# Patient Record
Sex: Female | Born: 1960 | Race: White | Hispanic: No | Marital: Married | Smoking: Current every day smoker
Health system: Southern US, Community
[De-identification: ages and names within clinical notes are randomized; demographics above are authoritative.]

## PROBLEM LIST (undated history)

## (undated) DIAGNOSIS — Q8740 Marfan's syndrome, unspecified: Secondary | ICD-10-CM

## (undated) DIAGNOSIS — J449 Chronic obstructive pulmonary disease, unspecified: Secondary | ICD-10-CM

## (undated) DIAGNOSIS — J45909 Unspecified asthma, uncomplicated: Secondary | ICD-10-CM

## (undated) DIAGNOSIS — I10 Essential (primary) hypertension: Secondary | ICD-10-CM

## (undated) HISTORY — PX: TUBAL LIGATION: SHX77

## (undated) HISTORY — DX: Marfan's syndrome, unspecified: Q87.40

## (undated) HISTORY — PX: MUSCLE BIOPSY: SHX716

## (undated) HISTORY — PX: ESOPHAGEAL DILATION: SHX303

## (undated) HISTORY — DX: Unspecified asthma, uncomplicated: J45.909

---

## 2004-12-11 ENCOUNTER — Emergency Department: Payer: Self-pay | Admitting: Emergency Medicine

## 2012-02-03 ENCOUNTER — Ambulatory Visit: Payer: Self-pay | Admitting: Unknown Physician Specialty

## 2012-02-04 LAB — COMPREHENSIVE METABOLIC PANEL
Albumin: 3.9 g/dL (ref 3.4–5.0)
Alkaline Phosphatase: 99 U/L (ref 50–136)
Anion Gap: 8 (ref 7–16)
BUN: 16 mg/dL (ref 7–18)
Calcium, Total: 8.5 mg/dL (ref 8.5–10.1)
EGFR (Non-African Amer.): >60
Glucose: 143 mg/dL — ABNORMAL HIGH (ref 65–99)
Potassium: 3.2 mmol/L — ABNORMAL LOW (ref 3.5–5.1)
SGOT(AST): 18 U/L (ref 15–37)
SGPT (ALT): 15 U/L (ref 12–78)
Total Protein: 7.7 g/dL (ref 6.4–8.2)

## 2012-02-04 LAB — CBC
HCT: 44.4 % (ref 35.0–47.0)
HGB: 14.8 g/dL (ref 12.0–16.0)
MCH: 30.7 pg (ref 26.0–34.0)
RBC: 4.82 10*6/uL (ref 3.80–5.20)
RDW: 13.1 % (ref 11.5–14.5)
WBC: 13.3 10*3/uL — ABNORMAL HIGH (ref 3.6–11.0)

## 2012-02-04 LAB — LIPASE, BLOOD: Lipase: 98 U/L (ref 73–393)

## 2012-02-04 LAB — MAGNESIUM: Magnesium: 1.9 mg/dL

## 2012-02-06 LAB — PATHOLOGY REPORT

## 2013-07-16 ENCOUNTER — Emergency Department: Payer: Self-pay | Admitting: Emergency Medicine

## 2014-09-15 ENCOUNTER — Encounter: Payer: Self-pay | Admitting: Internal Medicine

## 2014-09-15 ENCOUNTER — Ambulatory Visit (INDEPENDENT_AMBULATORY_CARE_PROVIDER_SITE_OTHER): Payer: No Typology Code available for payment source | Admitting: Internal Medicine

## 2014-09-15 VITALS — BP 143/74 | HR 77 | Temp 97.9°F | Ht 66.9 in | Wt 165.0 lb

## 2014-09-15 DIAGNOSIS — R1310 Dysphagia, unspecified: Secondary | ICD-10-CM | POA: Insufficient documentation

## 2014-09-15 DIAGNOSIS — G8929 Other chronic pain: Secondary | ICD-10-CM

## 2014-09-15 DIAGNOSIS — Z0000 Encounter for general adult medical examination without abnormal findings: Secondary | ICD-10-CM | POA: Insufficient documentation

## 2014-09-15 DIAGNOSIS — IMO0001 Reserved for inherently not codable concepts without codable children: Secondary | ICD-10-CM | POA: Insufficient documentation

## 2014-09-15 DIAGNOSIS — R1013 Epigastric pain: Secondary | ICD-10-CM

## 2014-09-15 DIAGNOSIS — Z720 Tobacco use: Secondary | ICD-10-CM

## 2014-09-15 DIAGNOSIS — J45909 Unspecified asthma, uncomplicated: Secondary | ICD-10-CM

## 2014-09-15 DIAGNOSIS — E663 Overweight: Secondary | ICD-10-CM | POA: Diagnosis not present

## 2014-09-15 DIAGNOSIS — R030 Elevated blood-pressure reading, without diagnosis of hypertension: Secondary | ICD-10-CM

## 2014-09-15 LAB — CBC WITH DIFFERENTIAL/PLATELET
Basophils Absolute: 0.1 10*3/uL (ref 0.0–0.1)
Basophils Relative: 1 % (ref 0–1)
EOS PCT: 5 % (ref 0–5)
Eosinophils Absolute: 0.5 10*3/uL (ref 0.0–0.7)
HCT: 46.5 % — ABNORMAL HIGH (ref 36.0–46.0)
Hemoglobin: 15.6 g/dL — ABNORMAL HIGH (ref 12.0–15.0)
Lymphocytes Relative: 19 % (ref 12–46)
Lymphs Abs: 1.9 10*3/uL (ref 0.7–4.0)
MCH: 30.1 pg (ref 26.0–34.0)
MCHC: 33.5 g/dL (ref 30.0–36.0)
MCV: 89.6 fL (ref 78.0–100.0)
MONOS PCT: 5 % (ref 3–12)
MPV: 9.2 fL (ref 8.6–12.4)
Monocytes Absolute: 0.5 10*3/uL (ref 0.1–1.0)
NEUTROS ABS: 6.9 10*3/uL (ref 1.7–7.7)
Neutrophils Relative %: 70 % (ref 43–77)
PLATELETS: 329 10*3/uL (ref 150–400)
RBC: 5.19 MIL/uL — ABNORMAL HIGH (ref 3.87–5.11)
RDW: 13.5 % (ref 11.5–15.5)
WBC: 9.9 10*3/uL (ref 4.0–10.5)

## 2014-09-15 LAB — LIPID PANEL
CHOLESTEROL: 185 mg/dL (ref 0–200)
HDL: 38 mg/dL — ABNORMAL LOW (ref >=46)
LDL Cholesterol: 120 mg/dL — ABNORMAL HIGH (ref 0–99)
TRIGLYCERIDES: 133 mg/dL (ref <150)
Total CHOL/HDL Ratio: 4.9 Ratio
VLDL: 27 mg/dL (ref 0–40)

## 2014-09-15 LAB — COMPLETE METABOLIC PANEL WITH GFR
ALT: 11 U/L (ref 0–35)
AST: 12 U/L (ref 0–37)
Albumin: 4.7 g/dL (ref 3.5–5.2)
Alkaline Phosphatase: 93 U/L (ref 39–117)
BUN: 15 mg/dL (ref 6–23)
CALCIUM: 9.4 mg/dL (ref 8.4–10.5)
CHLORIDE: 104 meq/L (ref 96–112)
CO2: 31 mEq/L (ref 19–32)
CREATININE: 0.66 mg/dL (ref 0.50–1.10)
GFR, Est African American: >89 mL/min
GFR, Est Non African American: >89 mL/min
Glucose, Bld: 99 mg/dL (ref 70–99)
Potassium: 4.2 mEq/L (ref 3.5–5.3)
SODIUM: 140 meq/L (ref 135–145)
Total Bilirubin: 0.3 mg/dL (ref 0.2–1.2)
Total Protein: 7.2 g/dL (ref 6.0–8.3)

## 2014-09-15 MED ORDER — OMEPRAZOLE 40 MG PO CPDR: 40.0000 mg | DELAYED_RELEASE_CAPSULE | Freq: Two times a day (BID) | ORAL | Status: DC

## 2014-09-15 MED ORDER — ALBUTEROL SULFATE HFA 108 (90 BASE) MCG/ACT IN AERS: 2.0000 | INHALATION_SPRAY | Freq: Four times a day (QID) | RESPIRATORY_TRACT | Status: DC | PRN

## 2014-09-15 NOTE — Assessment & Plan Note (Addendum)
BP today 157/76. Will continue to monitor and check CMP  ADDENDUM: CMP wnl   Farley LyAdam Travone Georg, MD Internal Medicine, PGY-1 Pager [phone] 09/16/2014, 11:37 AM

## 2014-09-15 NOTE — Assessment & Plan Note (Signed)
  Assessment: Progress toward smoking cessation:  smoking the same amount Barriers to progress toward smoking cessation:    Comments: She smokes about 1/3-1/4 ppd for the past 40 years. She says she has no interest in trying to quit now.  Plan: Instruction/counseling given:  I counseled patient on the dangers of tobacco use, advised patient to stop smoking, and reviewed strategies to maximize success. Educational resources provided:  [employer](1-800-QUIT-NOW) brochure (denies) Self management tools provided:    Medications to assist with smoking cessation:  None Patient agreed to the following self-care plans for smoking cessation:    Other plans: will continue to revisit at subsequent visits

## 2014-09-15 NOTE — Patient Instructions (Signed)
It was a pleasure to see you today. Please go to the GI (stomach) doctor. Please get your mammogram. Please do the lung breathing test. You are to take omeprazole 40 mg twice a day until you see the GI doctor. We will call you if there are any issues with your blood work. Please return to clinic or seek medical attention if you have any new or worsening abdominal pain, vomiting, fever, or other worrisome medical condition. We look forward to seeing you again in 2 months.  Farley LyAdam Erandi Lemma, MD  General Instructions:   Please try to bring all your medicines next time. This will help us keep you safe from mistakes.   Progress Toward Treatment Goals:  Treatment Goal 09/15/2014  Stop smoking smoking the same amount    Self Care Goals & Plans:  Self Care Goal 09/15/2014  Manage my medications (No Data)  Eat healthy foods drink diet soda or water instead of juice or soda; eat more vegetables; eat foods that are low in salt; eat baked foods instead of fried foods; eat fruit for snacks and desserts    No flowsheet data found.   Care Management & Community Referrals:  No flowsheet data found.

## 2014-09-15 NOTE — Assessment & Plan Note (Addendum)
Ms Blenda MountsMize reports chronic non-radiating epigastric abdominal pain that is worse with certain foods such as spaghetti with sauce or food with butter. She takes omeprazole 40 mg daily with no relief. She also has associated dysphagia (see separate problem). She does not note NSAID or alcohol use. Abdominal exam benign. Differential includes PUD, gastritis.  -CMP, CBC -referral to GI  ADDENDUM: CMP wnl makes hepatobiliary process unlikely. She is to follow-up with GI.   Farley LyAdam Shariff Lasky, MD Internal Medicine, PGY-1 Pager [phone] 09/16/2014, 11:38 AM

## 2014-09-15 NOTE — Assessment & Plan Note (Addendum)
Ms Wanda Rogers has agreed to mammogram as well as GI referral which will include colonoscopy. Will also check lipid panel, CBC, CMP today. She declined HIV testing.  ADDENDUM: CMP, CBC wnl. Lipid panel w total cholesterol 185, LDL 120, HDL 38, triglyceride 133. Will allow Wanda Rogers to discuss risks and benefits of statin with PCP and decide whether to initiate.   Farley LyAdam Raigan Baria, MD Internal Medicine, PGY-1 Pager [phone] 09/16/2014, 11:37 AM

## 2014-09-15 NOTE — Assessment & Plan Note (Addendum)
Ms Wanda Rogers believes she has a history of asthma. She is unsure if she had formal PFT in the past. She says she used to have albuterol nebulizer and rescue inhaler but has neither now. No wheezing on exam and sating 100% on  RA. She has 1/3 ppd x 40 year history of smoking -albuterol 2 puff inh q6hprn rescue inhaler prescribed -checking PFTs

## 2014-09-15 NOTE — Assessment & Plan Note (Signed)
Wanda Rogers reports dysphagia. She is unclear on the time line but says about a year and a half ago she went to the ED and had to have a scope to remove food that was stuck. She was told to follow-up with GI but did not because she did not have insurance at that time. She says this affects both liquid and solid and she thinks it has gotten progressively worse. She is currently taking omeprazole 40 mg daily. Differential includes GERD, eosinophilic esophagitis, achalasia, mass obstruction from cancer (she smokes, does not drink).  -refer to GI -increase omeprazole to 40 mg bid pending GI evaluation

## 2014-09-15 NOTE — Progress Notes (Signed)
   Subjective:    Patient ID: Wanda Rogers, female    DOB: 12/22/1960, 54 y.o.   MRN: [id number]  HPI  Wanda Rogers is a 54 year old woman with no past medical history of Marfan syndrome, asthma here to establish as a new patient. [facility] [place]. She says that she has had trouble with swallowing food. This is for both solids and liquids. It has gotten progressively worse. She said she once had to go to the ED and had to be scoped to remove food that was "stuck" in her throat about 1.5 years ago at [facility]. She says she got a letter saying to go back to the GI doctor to discuss something they saw but she did not have insurance at the time and never followed up. She also describes "knots" in the epigastric region. She says there is an achy pain that is always present but waxes and wanes in intensity. It does not radiate. She says some foods, such as those with butter, makes it worse. This has been for about the past year. She has occasional constipation but no diarrhea, nausea, emesis. She says she is taking omeprazole 40 mg daily.   She says she used to have a nebulizer machine and rescue inhaler. She says she can walk to her mailbox and then needs to catch her breath. She smokes about 3-4 cigarettes a day for about 40 years. She is not interested in quitting.  Review of Systems  Constitutional: Negative for fever, chills and diaphoresis.  Respiratory: Negative for chest tightness, shortness of breath and wheezing.   Cardiovascular: Negative for chest pain.  Gastrointestinal: Negative for nausea, vomiting, abdominal pain, diarrhea, constipation and blood in stool.  Neurological: Negative for dizziness, weakness, light-headedness and numbness.       Objective:   Physical Exam  Constitutional: She is oriented to person, place, and time. She appears well-developed and well-nourished. No distress.  HENT:  Head: Normocephalic and atraumatic.    Mouth/Throat: Oropharynx is clear and moist.  Eyes: EOM are normal. Pupils are equal, round, and reactive to light.  Cardiovascular: Normal rate, regular rhythm, normal heart sounds and intact distal pulses.  Exam reveals no gallop and no friction rub.   No murmur heard. Pulmonary/Chest: Effort normal and breath sounds normal. No respiratory distress. She has no wheezes.  Abdominal: Soft. Bowel sounds are normal. She exhibits no distension. There is no tenderness.  Neurological: She is alert and oriented to person, place, and time.  Skin: She is not diaphoretic.  Vitals reviewed.         Assessment & Plan:

## 2014-09-16 NOTE — Progress Notes (Signed)
Internal Medicine Clinic Attending  Case discussed with Dr. Rothman at the time of the visit.  We reviewed the resident's history and exam and pertinent patient test results.  I agree with the assessment, diagnosis, and plan of care documented in the resident's note. 

## 2014-10-01 ENCOUNTER — Encounter: Payer: Self-pay | Admitting: *Deleted

## 2014-10-06 ENCOUNTER — Ambulatory Visit (HOSPITAL_COMMUNITY)
Admission: RE | Admit: 2014-10-06 | Discharge: 2014-10-06 | Disposition: A | Discharge status: Home / Self Care | Payer: No Typology Code available for payment source | Source: Ambulatory Visit | Attending: Internal Medicine | Admitting: Internal Medicine

## 2014-10-06 DIAGNOSIS — J45909 Unspecified asthma, uncomplicated: Secondary | ICD-10-CM | POA: Insufficient documentation

## 2014-10-06 LAB — PULMONARY FUNCTION TEST
DL/VA % PRED: 76 %
DL/VA: 3.96 ml/min/mmHg/L
DLCO UNC % PRED: 64 %
DLCO unc: 18.33 ml/min/mmHg
FEF 25-75 PRE: 0.66 L/s
FEF 25-75 Post: 1.5 L/sec
FEF2575-%CHANGE-POST: 129 %
FEF2575-%Pred-Post: 54 %
FEF2575-%Pred-Pre: 23 %
FEV1-%CHANGE-POST: 26 %
FEV1-%PRED-PRE: 58 %
FEV1-%Pred-Post: 73 %
FEV1-POST: 2.21 L
FEV1-Pre: 1.75 L
FEV1FVC-%Change-Post: 5 %
FEV1FVC-%PRED-PRE: 69 %
FEV6-%Change-Post: 23 %
FEV6-%PRED-POST: 98 %
FEV6-%Pred-Pre: 79 %
FEV6-PRE: 2.95 L
FEV6-Post: 3.65 L
FEV6FVC-%Change-Post: 3 %
FEV6FVC-%Pred-Post: 99 %
FEV6FVC-%Pred-Pre: 95 %
FVC-%Change-Post: 19 %
FVC-%Pred-Post: 99 %
FVC-%Pred-Pre: 83 %
FVC-Post: 3.79 L
FVC-Pre: 3.17 L
POST FEV1/FVC RATIO: 58 %
POST FEV6/FVC RATIO: 96 %
Pre FEV1/FVC ratio: 55 %
Pre FEV6/FVC Ratio: 93 %
RV % pred: 148 %
RV: 3.01 L
TLC % PRED: 115 %
TLC: 6.33 L

## 2014-10-06 MED ORDER — ALBUTEROL SULFATE (2.5 MG/3ML) 0.083% IN NEBU
2.5000 mg | INHALATION_SOLUTION | Freq: Once | RESPIRATORY_TRACT | Status: AC
  Administered 2014-10-06: 2.5 mg via RESPIRATORY_TRACT

## 2014-10-07 ENCOUNTER — Encounter: Payer: Self-pay | Admitting: Gastroenterology

## 2014-10-13 NOTE — Consult Note (Signed)
PATIENT NAME:  Wanda Rogers, Wanda Rogers MR#:  [id number] DATE OF BIRTH:  1961-04-26  DATE OF CONSULTATION:  02/04/2012  REFERRING PHYSICIAN:   CONSULTING PHYSICIAN:  Scot Junobert T. Liyla Radliff, MD  HISTORY: The patient is a 54 year old white female who was eating homemade soup and a piece of beef got stuck in her esophagus. She went to the ER. The impaction occurred about 11 or 12 hours ago. She was given the usual treatments in the ER to try to induce this to pass. It did not do so. I was called and asked to see her.   HABITS: Smokes about five cigarettes a day. Smoked all of her adult life. Does not drink alcohol.   CURRENT MEDICATIONS:  1. Over-the-counter Benadryl. 2. Goody powder. 3. Over-the-counter Prilosec.    ALLERGIES: Does not tolerate Flexeril.   PAST MEDICAL HISTORY: She has had childbirth 5 times by vaginal delivery. No other hospitalizations operations.   The patient has had problems off and on swallowing meat. She denies any hematemesis. No asthma, wheezing or emphysema. No known heart disease.   PHYSICAL EXAMINATION:  GENERAL: White female in no acute distress.   VITAL SIGNS: Temperature 97.9, pulse 74, blood pressure 138/76.   HEENT: Sclerae anicteric. Conjunctiva negative. Tongue negative. Head atraumatic.   CHEST: Clear.   HEART: No murmurs or gallops.   ABDOMEN: No hepatosplenomegaly. No masses. No bruits. No significant tenderness.   SKIN: Warm and dry.   PSYCHIATRIC: Mood and affect are appropriate.   LABORATORY, DIAGNOSTIC AND RADIOLOGICAL DATA: Glucose 143, potassium 3.2, chloride 108. Liver panel normal. White blood count 13.3, hemoglobin 14.8.   ASSESSMENT: Foreign body obstruction of esophagus.   PLAN: Foreign body removal with anesthesia assistance this morning.    ____________________________ Scot Junobert T. Bay Wayson, MD rte:ap D: 02/04/2012 06:29:42 ET T: 02/04/2012 07:39:25 ET JOB#: [id number]  cc: Scot Junobert T. Quamere Mussell, MD, <Dictator> Scot JunOBERT T Hal Norrington  MD ELECTRONICALLY SIGNED 02/13/2012 18:32

## 2014-12-02 ENCOUNTER — Other Ambulatory Visit: Payer: Self-pay | Admitting: Internal Medicine

## 2014-12-14 ENCOUNTER — Ambulatory Visit (INDEPENDENT_AMBULATORY_CARE_PROVIDER_SITE_OTHER): Payer: No Typology Code available for payment source | Admitting: Gastroenterology

## 2014-12-14 ENCOUNTER — Encounter: Payer: Self-pay | Admitting: Gastroenterology

## 2014-12-14 VITALS — BP 124/90 | HR 76 | Ht 66.0 in | Wt 160.0 lb

## 2014-12-14 DIAGNOSIS — R1013 Epigastric pain: Secondary | ICD-10-CM | POA: Diagnosis not present

## 2014-12-14 DIAGNOSIS — G8929 Other chronic pain: Secondary | ICD-10-CM

## 2014-12-14 DIAGNOSIS — K5900 Constipation, unspecified: Secondary | ICD-10-CM

## 2014-12-14 DIAGNOSIS — R1310 Dysphagia, unspecified: Secondary | ICD-10-CM | POA: Diagnosis not present

## 2014-12-14 NOTE — Patient Instructions (Addendum)
You have been scheduled for an endoscopy. Please follow written instructions given to you at your visit today. If you use inhalers (even only as needed), please bring them with you on the day of your procedure. Your physician has requested that you go to www.startemmi.com and enter the access code given to you at your visit today. This web site gives a general overview about your procedure. However, you should still follow specific instructions given to you by our office regarding your preparation for the procedure.  It has been recommended to you by your physician that you have a(n) Screening colonoscopy completed. Per your request, we did not schedule the procedure(s) today. Please contact our office at [phone] should you decide to have the procedure completed.  Use Benefiber daily Use Miralax every 3 days as needed for constipation

## 2014-12-14 NOTE — Assessment & Plan Note (Signed)
History of dysphagia to solids and food impaction one year ago.  Strongly suspect she has an esophageal stricture.    Recommendations #1 upper endoscopy with dilation as indicated  Risks, alternatives, and complications of the procedure, including bleeding, perforation, and possible need for surgery, were explained to the patient.  Patient's questions were answered.  Cc Dr. Valentino Saxon

## 2014-12-14 NOTE — Assessment & Plan Note (Signed)
Abdominal pain is probably related to abdominal wall pain.  I don't appreciate a nodule, per se.  Recommendations #1 local heat and non-steroidal's

## 2014-12-14 NOTE — Progress Notes (Signed)
    _                                                                                                                History of Present Illness:  Wanda Rogers is a 54 year old white female referred at the request of Dr. Valentino Saxon for evaluation of dysphagia and abdominal pain.  She complains of dysphagia to solids.  Approximate year ago she had a food impaction requiring emergency endoscopy.  She has rare pyrosis.  She complains of a painful nodule in her upper abdomen.  She has chronic constipation and may move her bowels once every 5-6 days.  There is no history of rectal bleeding.   Past Medical History  Diagnosis Date  . Marfan's syndrome   . Asthma    History reviewed. No pertinent past surgical history. family history includes Colon cancer in her maternal grandmother; Heart disease in her son. Current Outpatient Prescriptions  Medication Sig Dispense Refill  . omeprazole (PRILOSEC) 40 MG capsule TAKE ONE CAPSULE BY MOUTH TWICE DAILY 60 capsule 3  . albuterol (PROVENTIL HFA;VENTOLIN HFA) 108 (90 BASE) MCG/ACT inhaler Inhale 2 puffs into the lungs every 6 (six) hours as needed for wheezing or shortness of breath (cough). (Patient not taking: Reported on 09/15/2014) 1 Inhaler 0   No current facility-administered medications for this visit.   Allergies as of 12/14/2014 - Review Complete 12/14/2014  Allergen Reaction Noted  . Flexeril [cyclobenzaprine] Nausea And Vomiting 09/15/2014    reports that she has been smoking Cigarettes.  She has been smoking about 0.30 packs per day. She does not have any smokeless tobacco history on file. Her alcohol and drug histories are not on file.   Review of Systems: Pertinent positive and negative review of systems were noted in the above HPI section. All other review of systems were otherwise negative.  Vital signs were reviewed in today's medical record Physical Exam: General: Well developed , well nourished, no acute distress Skin:  anicteric Head: Normocephalic and atraumatic Eyes:  sclerae anicteric, EOMI Ears: Normal auditory acuity Mouth: No deformity or lesions Neck: Supple, no masses or thyromegaly Lymph Nodes: no lymphadenopathy Lungs: Clear throughout to auscultation Heart: Regular rate and rhythm; no murmurs, rubs or bruits Gastroinestinal: Soft, and non distended. No masses, hepatosplenomegaly or hernias noted. Normal Bowel sounds.  There is very mild tenderness in the mid epigastrium to palpation which increases with abdominal muscle wall flexion.  I do not appreciate any nodules or masses. Rectal:deferred Musculoskeletal: Symmetrical with no gross deformities  Skin: No lesions on visible extremities Pulses:  Normal pulses noted Extremities: No clubbing, cyanosis, edema or deformities noted Neurological: Alert oriented x 4, grossly nonfocal Cervical Nodes:  No significant cervical adenopathy Inguinal Nodes: No significant inguinal adenopathy Psychological:  Alert and cooperative. Normal mood and affect  See Assessment and Plan under Problem List

## 2014-12-14 NOTE — Assessment & Plan Note (Signed)
Constipation is probably functional.  Patient has never had a colonoscopy.  Recommendations #1 fiber supplementation daily; MiraLAX every 3 days as needed #2 screening colonoscopy

## 2014-12-17 ENCOUNTER — Encounter: Payer: Self-pay | Admitting: Gastroenterology

## 2014-12-17 ENCOUNTER — Ambulatory Visit (AMBULATORY_SURGERY_CENTER): Payer: No Typology Code available for payment source | Admitting: Gastroenterology

## 2014-12-17 VITALS — BP 138/81 | HR 61 | Temp 97.5°F | Resp 16 | Ht 66.0 in | Wt 160.0 lb

## 2014-12-17 DIAGNOSIS — R1013 Epigastric pain: Secondary | ICD-10-CM

## 2014-12-17 DIAGNOSIS — K222 Esophageal obstruction: Secondary | ICD-10-CM

## 2014-12-17 DIAGNOSIS — R1310 Dysphagia, unspecified: Secondary | ICD-10-CM | POA: Diagnosis not present

## 2014-12-17 MED ORDER — SODIUM CHLORIDE 0.9 % IV SOLN: 500.0000 mL | INTRAVENOUS | Status: DC

## 2014-12-17 NOTE — Progress Notes (Signed)
Called to room to assist during endoscopic procedure.  Patient ID and intended procedure confirmed with present staff. Received instructions for my participation in the procedure from the performing physician.  

## 2014-12-17 NOTE — Patient Instructions (Signed)
YOU HAD AN ENDOSCOPIC PROCEDURE TODAY AT THE [facility]:   Refer to the procedure report that was given to you for any specific questions about what was found during the examination.  If the procedure report does not answer your questions, please call your gastroenterologist to clarify.  If you requested that your care partner not be given the details of your procedure findings, then the procedure report has been included in a sealed envelope for you to review at your convenience later.  YOU SHOULD EXPECT: Some feelings of bloating in the abdomen. Passage of more gas than usual.  Walking can help get rid of the air that was put into your GI tract during the procedure and reduce the bloating. If you had a lower endoscopy (such as a colonoscopy or flexible sigmoidoscopy) you may notice spotting of blood in your stool or on the toilet paper. If you underwent a bowel prep for your procedure, you may not have a normal bowel movement for a few days.  Please Note:  You might notice some irritation and congestion in your nose or some drainage.  This is from the oxygen used during your procedure.  There is no need for concern and it should clear up in a day or so.  SYMPTOMS TO REPORT IMMEDIATELY:   Following upper endoscopy (EGD)  Vomiting of blood or coffee ground material  New chest pain or pain under the shoulder blades  Painful or persistently difficult swallowing  New shortness of breath  Fever of 100F or higher  Black, tarry-looking stools  For urgent or emergent issues, a gastroenterologist can be reached at any hour by calling [phone].   DIET:NOTHING TO EAT OR DRINK UNTIL 2 PM. 2-3 PM ONLY CLEAR LIQUIDS. 3 PM UNTIL MORNING ONLY SOFT FOODS. RESUME YOUR REGULAR DIET IN AM.  ACTIVITY:  You should plan to take it easy for the rest of today and you should NOT DRIVE or use heavy machinery until tomorrow (because of the sedation medicines used during the test).    FOLLOW  UP: Our staff will call the number listed on your records the next business day following your procedure to check on you and address any questions or concerns that you may have regarding the information given to you following your procedure. If we do not reach you, we will leave a message.  However, if you are feeling well and you are not experiencing any problems, there is no need to return our call.  We will assume that you have returned to your regular daily activities without incident.  If any biopsies were taken you will be contacted by phone or by letter within the next 1-3 weeks.  Please call us at [phone] if you have not heard about the biopsies in 3 weeks.    SIGNATURES/CONFIDENTIALITY: You and/or your care partner have signed paperwork which will be entered into your electronic medical record.  These signatures attest to the fact that that the information above on your After Visit Summary has been reviewed and is understood.  Full responsibility of the confidentiality of this discharge information lies with you and/or your care-partner.

## 2014-12-17 NOTE — Op Note (Signed)
[facility] [address] [place], [address]   ENDOSCOPY PROCEDURE REPORT  PATIENT: Wanda, Rogers  MR#: [id number] BIRTHDATE: [birth year] , 53  yrs. old GENDER: female ENDOSCOPIST: Louis Meckel, MD REFERRED BY: PROCEDURE DATE:  12/17/2014 PROCEDURE:  EGD, diagnostic and Maloney dilation of esophagus ASA CLASS:     Class II INDICATIONS:  dysphagia. MEDICATIONS: Propofol 150 mg IV TOPICAL ANESTHETIC:  DESCRIPTION OF PROCEDURE: After the risks benefits and alternatives of the procedure were thoroughly explained, informed consent was obtained.  The LB [id number] endoscope was introduced through the mouth and advanced to the second portion of the duodenum , Without limitations.  The instrument was slowly withdrawn as the mucosa was fully examined.    ESOPHAGUS: There was a peptic stricture at the gastroesophageal junction.  The stricture was traversable.  The stricture was dilated using a 52mm (54Fr) Maloney dilator.   Except for the findings listed, the EGD was otherwise normal.  Retroflexed views revealed no abnormalities.     The scope was then withdrawn from the patient and the procedure completed.  COMPLICATIONS: There were no immediate complications.  ENDOSCOPIC IMPRESSION: 1.   There was a stricture at the gastroesophageal junction; The stricture was dilated using a 64mm (54Fr) Maloney dilator 2.   EGD was otherwise normal  RECOMMENDATIONS: EP dilation as needed  REPEAT EXAM:  eSigned:  Louis Meckel, MD 12/17/2014 1:12 PM    CC: Farley Ly, M.D.

## 2014-12-17 NOTE — Progress Notes (Signed)
Report to PACU, RN, vss, BBS= Clear.  

## 2014-12-18 ENCOUNTER — Telehealth: Payer: Self-pay | Admitting: *Deleted

## 2014-12-18 NOTE — Telephone Encounter (Signed)
No answer, left message to call if questions or concerns. 

## 2015-03-18 ENCOUNTER — Encounter: Payer: Self-pay | Admitting: Internal Medicine

## 2015-03-18 ENCOUNTER — Ambulatory Visit (INDEPENDENT_AMBULATORY_CARE_PROVIDER_SITE_OTHER): Payer: No Typology Code available for payment source | Admitting: Internal Medicine

## 2015-03-18 VITALS — BP 145/74 | HR 73 | Temp 98.1°F | Ht 66.9 in | Wt 161.0 lb

## 2015-03-18 DIAGNOSIS — Z7951 Long term (current) use of inhaled steroids: Secondary | ICD-10-CM

## 2015-03-18 DIAGNOSIS — F17210 Nicotine dependence, cigarettes, uncomplicated: Secondary | ICD-10-CM

## 2015-03-18 DIAGNOSIS — J4540 Moderate persistent asthma, uncomplicated: Secondary | ICD-10-CM

## 2015-03-18 MED ORDER — BUDESONIDE 180 MCG/ACT IN AEPB: 2.0000 | INHALATION_SPRAY | Freq: Two times a day (BID) | RESPIRATORY_TRACT | Status: AC

## 2015-03-18 NOTE — Patient Instructions (Signed)
Thanks for your visit today Please start using the inhaler- pulmicort 2 puffs twice a day Please follow up in 2 monthsAsthma Asthma is a recurring condition in which the airways tighten and narrow. Asthma can make it difficult to breathe. It can cause coughing, wheezing, and shortness of breath. Asthma episodes, also called asthma attacks, range from minor to life-threatening. Asthma cannot be cured, but medicines and lifestyle changes can help control it. CAUSES Asthma is believed to be caused by inherited (genetic) and environmental factors, but its exact cause is unknown. Asthma may be triggered by allergens, lung infections, or irritants in the air. Asthma triggers are different for each person. Common triggers include:   Animal dander.  Dust mites.  Cockroaches.  Pollen from trees or grass.  Mold.  Smoke.  Air pollutants such as dust, household cleaners, hair sprays, aerosol sprays, paint fumes, strong chemicals, or strong odors.  Cold air, weather changes, and winds (which increase molds and pollens in the air).  Strong emotional expressions such as crying or laughing hard.  Stress.  Certain medicines (such as aspirin) or types of drugs (such as beta-blockers).  Sulfites in foods and drinks. Foods and drinks that may contain sulfites include dried fruit, potato chips, and sparkling grape juice.  Infections or inflammatory conditions such as the flu, a cold, or an inflammation of the nasal membranes (rhinitis).  Gastroesophageal reflux disease (GERD).  Exercise or strenuous activity. SYMPTOMS Symptoms may occur immediately after asthma is triggered or many hours later. Symptoms include:  Wheezing.  Excessive nighttime or early morning coughing.  Frequent or severe coughing with a common cold.  Chest tightness.  Shortness of breath. DIAGNOSIS  The diagnosis of asthma is made by a review of your medical history and a physical exam. Tests may also be performed.  These may include:  Lung function studies. These tests show how much air you breathe in and out.  Allergy tests.  Imaging tests such as X-rays. TREATMENT  Asthma cannot be cured, but it can usually be controlled. Treatment involves identifying and avoiding your asthma triggers. It also involves medicines. There are 2 classes of medicine used for asthma treatment:   Controller medicines. These prevent asthma symptoms from occurring. They are usually taken every day.  Reliever or rescue medicines. These quickly relieve asthma symptoms. They are used as needed and provide short-term relief. Your health care provider will help you create an asthma action plan. An asthma action plan is a written plan for managing and treating your asthma attacks. It includes a list of your asthma triggers and how they may be avoided. It also includes information on when medicines should be taken and when their dosage should be changed. An action plan may also involve the use of a device called a peak flow meter. A peak flow meter measures how well the lungs are working. It helps you monitor your condition. HOME CARE INSTRUCTIONS   Take medicines only as directed by your health care provider. Speak with your health care provider if you have questions about how or when to take the medicines.  Use a peak flow meter as directed by your health care provider. Record and keep track of readings.  Understand and use the action plan to help minimize or stop an asthma attack without needing to seek medical care.  Control your home environment in the following ways to help prevent asthma attacks:  Do not smoke. Avoid being exposed to secondhand smoke.  Change your heating and air  conditioning filter regularly.  Limit your use of fireplaces and wood stoves.  Get rid of pests (such as roaches and mice) and their droppings.  Throw away plants if you see mold on them.  Clean your floors and dust regularly. Use unscented  cleaning products.  Try to have someone else vacuum for you regularly. Stay out of rooms while they are being vacuumed and for a short while afterward. If you vacuum, use a dust mask from a hardware store, a double-layered or microfilter vacuum cleaner bag, or a vacuum cleaner with a HEPA filter.  Replace carpet with wood, tile, or vinyl flooring. Carpet can trap dander and dust.  Use allergy-proof pillows, mattress covers, and box spring covers.  Wash bed sheets and blankets every week in hot water and dry them in a dryer.  Use blankets that are made of polyester or cotton.  Clean bathrooms and kitchens with bleach. If possible, have someone repaint the walls in these rooms with mold-resistant paint. Keep out of the rooms that are being cleaned and painted.  Wash hands frequently. SEEK MEDICAL CARE IF:   You have wheezing, shortness of breath, or a cough even if taking medicine to prevent attacks.  The colored mucus you cough up (sputum) is thicker than usual.  Your sputum changes from clear or white to yellow, green, gray, or bloody.  You have any problems that may be related to the medicines you are taking (such as a rash, itching, swelling, or trouble breathing).  You are using a reliever medicine more than 2-3 times per week.  Your peak flow is still at 50-79% of your personal best after following your action plan for 1 hour.  You have a fever. SEEK IMMEDIATE MEDICAL CARE IF:   You seem to be getting worse and are unresponsive to treatment during an asthma attack.  You are short of breath even at rest.  You get short of breath when doing very little physical activity.  You have difficulty eating, drinking, or talking due to asthma symptoms.  You develop chest pain.  You develop a fast heartbeat.  You have a bluish color to your lips or fingernails.  You are light-headed, dizzy, or faint.  Your peak flow is less than 50% of your personal best. MAKE SURE YOU:    Understand these instructions.  Will watch your condition.  Will get help right away if you are not doing well or get worse. Document Released: 06/12/2005 Document Revised: 10/27/2013 Document Reviewed: 01/09/2013 [facility] Patient Information 2015 [place], [place]. This information is not intended to replace advice given to you by your health care provider. Make sure you discuss any questions you have with your health care provider.

## 2015-03-18 NOTE — Progress Notes (Signed)
Medicine attending: I personally interviewed and briefly examined this patient, and reviewed pertinent clinical laboratory  data  with resident physician Dr.Parth Johnny Bridge. and we discussed a  management plan. We will add inhaled steroid to her bronchodilator. Smoking cessation counseling done.

## 2015-03-18 NOTE — Progress Notes (Signed)
Patient ID: Wanda Rogers, female   DOB: 09-18-1960, 54 y.o.   MRN: [id number]    Subjective:   Patient ID: Wanda Rogers female   DOB: February 24, 1961 54 y.o.   MRN: [id number]  HPI: [name] is a 54 y.o. 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     Past Medical History  Diagnosis Date  . Marfan's syndrome   . Asthma    Current Outpatient Prescriptions  Medication Sig Dispense Refill  . albuterol (PROVENTIL HFA;VENTOLIN HFA) 108 (90 BASE) MCG/ACT inhaler Inhale 2 puffs into the lungs every 6 (six) hours as needed for wheezing or shortness of breath (cough). 1 Inhaler 0  . budesonide (PULMICORT) 180 MCG/ACT inhaler Inhale 2 puffs into the lungs 2 (two) times daily. 1 Inhaler 5  . omeprazole (PRILOSEC) 40 MG capsule TAKE ONE CAPSULE BY MOUTH TWICE DAILY 60 capsule 3   No current facility-administered medications for this visit.   Family History  Problem Relation Age of Onset  . Colon cancer Maternal Grandmother   . Heart disease Son   . Kidney disease Son   . Liver disease Son    Social History   Social History  . Marital Status: Married    Spouse Name: N/A  . Number of Children: N/A  . Years of Education: N/A   Social History Main Topics  . Smoking status: Current Every Day Smoker -- 0.30 packs/day    Types: Cigarettes  . Smokeless tobacco: None  . Alcohol Use: 0.6 oz/week    0 Standard drinks or equivalent, 1 Cans of beer per week  . Drug Use: None  . Sexual Activity: Not Asked   Other Topics Concern  . None   Social History Narrative   Review of Systems: Review of Systems  Constitutional: Negative for fever and chills.  Respiratory: Positive for cough, shortness of breath and wheezing. Negative for sputum production.   Cardiovascular: Negative for chest pain and palpitations.  Gastrointestinal: Negative for heartburn, nausea and vomiting.  Neurological: Negative for headaches.    Objective:  Physical Exam: Filed Vitals:   03/18/15 1525  BP: 145/74  Pulse: 73  Temp: 98.1 F (36.7 C)  TempSrc: Oral  Height: 5' 6.9" (1.699 m)  Weight: 161 lb (73.029 kg)  SpO2: 96%   Physical Exam  Constitutional: She appears well-developed and well-nourished.  HENT:  Head: Normocephalic and atraumatic.  Neck: Normal range of motion.  Cardiovascular: Normal rate, regular rhythm, normal heart sounds and intact distal pulses.   No murmur heard. Pulmonary/Chest: Effort normal and breath sounds normal. She has no wheezes. She exhibits no tenderness.  Lymphadenopathy:    She has no cervical adenopathy.  Skin: Skin is warm.    Assessment & Plan:   Please see problem based charting for assessment and plan

## 2015-03-18 NOTE — Assessment & Plan Note (Signed)
Pt has 2 week history of dry cough, and says she feels congested. She wakes up at night coughing. She smokes 1/2 ppd for 40 years, also there is second hand smoke in her house.  She has been having to use her rescue albuterol inhaler upto 4 times a day now.  She denies systemic symptoms, no sore throat, productive cough, no URI type symptoms   She had PFTS done 3 months back which showed FEV1/FVC ratio of 55% with decreased DLCO, and moderate-severe airway obstructive disease, which was reversible on the test.   HEENT, and pulmonary exam unremarkable  Likely exacerbation of mild-moderate persistent asthma  Plan- Given Pulmicort 2 puffs bid, continue the albuterol Counseled regarding smoking cessation RTC in 2 months to reassess, sooner if needed

## 2015-04-09 ENCOUNTER — Other Ambulatory Visit: Payer: Self-pay | Admitting: Pulmonary Disease

## 2015-04-21 ENCOUNTER — Emergency Department: Payer: No Typology Code available for payment source

## 2015-04-21 ENCOUNTER — Emergency Department
Admission: EM | Admit: 2015-04-21 | Discharge: 2015-04-21 | Disposition: A | Discharge status: Home / Self Care | Payer: No Typology Code available for payment source | Attending: Emergency Medicine | Admitting: Emergency Medicine

## 2015-04-21 ENCOUNTER — Encounter: Payer: Self-pay | Admitting: *Deleted

## 2015-04-21 DIAGNOSIS — W010XXA Fall on same level from slipping, tripping and stumbling without subsequent striking against object, initial encounter: Secondary | ICD-10-CM | POA: Insufficient documentation

## 2015-04-21 DIAGNOSIS — Z720 Tobacco use: Secondary | ICD-10-CM | POA: Diagnosis not present

## 2015-04-21 DIAGNOSIS — S62396A Other fracture of fifth metacarpal bone, right hand, initial encounter for closed fracture: Secondary | ICD-10-CM | POA: Insufficient documentation

## 2015-04-21 DIAGNOSIS — Y9389 Activity, other specified: Secondary | ICD-10-CM | POA: Diagnosis not present

## 2015-04-21 DIAGNOSIS — Z79899 Other long term (current) drug therapy: Secondary | ICD-10-CM | POA: Insufficient documentation

## 2015-04-21 DIAGNOSIS — Y9289 Other specified places as the place of occurrence of the external cause: Secondary | ICD-10-CM | POA: Diagnosis not present

## 2015-04-21 DIAGNOSIS — S62309A Unspecified fracture of unspecified metacarpal bone, initial encounter for closed fracture: Secondary | ICD-10-CM

## 2015-04-21 DIAGNOSIS — Y998 Other external cause status: Secondary | ICD-10-CM | POA: Insufficient documentation

## 2015-04-21 DIAGNOSIS — S6991XA Unspecified injury of right wrist, hand and finger(s), initial encounter: Secondary | ICD-10-CM | POA: Diagnosis present

## 2015-04-21 HISTORY — DX: Marfan's syndrome, unspecified: Q87.40

## 2015-04-21 MED ORDER — IBUPROFEN 800 MG PO TABS: 800.0000 mg | ORAL_TABLET | Freq: Three times a day (TID) | ORAL | Status: DC | PRN

## 2015-04-21 MED ORDER — TRAMADOL HCL 50 MG PO TABS: 50.0000 mg | ORAL_TABLET | Freq: Four times a day (QID) | ORAL | Status: DC | PRN

## 2015-04-21 MED ORDER — IBUPROFEN 800 MG PO TABS
800.0000 mg | ORAL_TABLET | Freq: Once | ORAL | Status: AC
  Administered 2015-04-21: 800 mg via ORAL
  Filled 2015-04-21: qty 1

## 2015-04-21 MED ORDER — OXYCODONE-ACETAMINOPHEN 5-325 MG PO TABS
1.0000 | ORAL_TABLET | Freq: Once | ORAL | Status: AC
Start: 2015-04-21 — End: 2015-04-21
  Administered 2015-04-21: 1 via ORAL
  Filled 2015-04-21: qty 1

## 2015-04-21 NOTE — ED Provider Notes (Signed)
[facility] Emergency Department Provider Note  ____________________________________________  Time seen: Approximately 1:00 PM  I have reviewed the triage vital signs and the nursing notes.   HISTORY  Chief Complaint Hand Injury    HPI Wanda Rogers is a 54 y.o. female patient complaining of right hand pain and swelling secondary to a fall this morning. Patient states she slipped and fell breaking the fall with the right hand. Since the incident patient's have increased swelling and pain decreased range of motion with flexion of the finger the fourth digit right hand.Patient rates her pain discomfort as a 10 over 10. No palliative measures taken for this complaint. Patient is right-hand dominant.   Past Medical History  Diagnosis Date  . Marfan's syndrome   . Asthma   . Marfan syndrome     Patient Active Problem List   Diagnosis Date Noted  . Constipation 12/14/2014  . Elevated blood pressure 09/15/2014  . Dysphagia 09/15/2014  . Abdominal pain, chronic, epigastric 09/15/2014  . Overweight 09/15/2014  . Preventative health care 09/15/2014  . Asthma, chronic 09/15/2014  . Tobacco abuse 09/15/2014    Past Surgical History  Procedure Laterality Date  . Muscle biopsy    . Tubal ligation      Current Outpatient Rx  Name  Route  Sig  Dispense  Refill  . albuterol (PROVENTIL HFA;VENTOLIN HFA) 108 (90 BASE) MCG/ACT inhaler   Inhalation   Inhale 2 puffs into the lungs every 6 (six) hours as needed for wheezing or shortness of breath (cough).   1 Inhaler   0   . budesonide (PULMICORT) 180 MCG/ACT inhaler   Inhalation   Inhale 2 puffs into the lungs 2 (two) times daily.   1 Inhaler   5   . ibuprofen (ADVIL,MOTRIN) 800 MG tablet   Oral   Take 1 tablet (800 mg total) by mouth every 8 (eight) hours as needed for moderate pain.   15 tablet   0   . omeprazole (PRILOSEC) 40 MG capsule   Oral   Take 1 capsule (40 mg total) by mouth 2 (two) times  daily.   60 capsule   11   . traMADol (ULTRAM) 50 MG tablet   Oral   Take 1 tablet (50 mg total) by mouth every 6 (six) hours as needed for moderate pain.   12 tablet   0     Allergies Flexeril  Family History  Problem Relation Age of Onset  . Colon cancer Maternal Grandmother   . Heart disease Son   . Kidney disease Son   . Liver disease Son     Social History Social History  Substance Use Topics  . Smoking status: Current Every Day Smoker -- 0.30 packs/day    Types: Cigarettes  . Smokeless tobacco: None  . Alcohol Use: 0.6 oz/week    1 Cans of beer, 0 Standard drinks or equivalent per week    Review of Systems Constitutional: No fever/chills Eyes: No visual changes. ENT: No sore throat. Cardiovascular: Denies chest pain. Respiratory: Denies shortness of breath. Gastrointestinal: No abdominal pain.  No nausea, no vomiting.  No diarrhea.  No constipation. Genitourinary: Negative for dysuria. Musculoskeletal: Right hand pain.  Skin: Negative for rash. Neurological: Negative for headaches, focal weakness or numbness. Endocrine:Marfan syndrome Hematological/Lymphatic: Allergic/Immunilogical Flexeril  10-point ROS otherwise negative.  ____________________________________________   PHYSICAL EXAM:  VITAL SIGNS: ED Triage Vitals  Enc Vitals Group     BP 04/21/15 1246 165/91 mmHg  Pulse Rate 04/21/15 1246 72     Resp 04/21/15 1246 18     Temp 04/21/15 1246 98.1 F (36.7 C)     Temp Source 04/21/15 1246 Oral     SpO2 04/21/15 1246 99 %     Weight --      Height --      Head Cir --      Peak Flow --      Pain Score 04/21/15 1243 10     Pain Loc --      Pain Edu? --      Excl. in GC? --     Constitutional: Alert and oriented. Well appearing and in no acute distress. Eyes: Conjunctivae are normal. PERRL. EOMI. Head: Atraumatic. Nose: No congestion/rhinnorhea. Mouth/Throat: Mucous membranes are moist.  Oropharynx non-erythematous. Neck: No  stridor.  No cervical spine tenderness to palpation. Hematological/Lymphatic/Immunilogical: No cervical lymphadenopathy. Cardiovascular: Normal rate, regular rhythm. Grossly normal heart sounds.  Good peripheral circulation. Respiratory: Normal respiratory effort.  No retractions. Lungs CTAB. Gastrointestinal: Soft and nontender. No distention. No abdominal bruits. No CVA tenderness. Musculoskeletal: No obvious deformity for marked edema to the dorsal expect of the right hand along the third and fourth metacarpal.  Neurologic:  Normal speech and language. No gross focal neurologic deficits are appreciated. No gait instability. Skin:  Skin is warm, dry and intact. No rash noted. Psychiatric: Mood and affect are normal. Speech and behavior are normal.  ____________________________________________   LABS (all labs ordered are listed, but only abnormal results are displayed)  Labs Reviewed - No data to display ____________________________________________  EKG   ____________________________________________  RADIOLOGY  Fifth metatarsal head fracture. I, Joni Reiningonald K Leesha Veno, personally viewed and evaluated these images (plain radiographs) as part of my medical decision making.   ____________________________________________   PROCEDURES  Procedure(s) performed: None  Critical Care performed: No  ____________________________________________   INITIAL IMPRESSION / ASSESSMENT AND PLAN / ED COURSE  Pertinent labs & imaging results that were available during my care of the patient were reviewed by me and considered in my medical decision making (see chart for details).  Right fifth metacarpal head fracture. Discussed x-ray findings with patient. Patient placed in ulnar gutter splint and [facility]. Patient a prescription for tramadol and ibuprofen. ____________________________________________   FINAL CLINICAL IMPRESSION(S) / ED DIAGNOSES  Final  diagnoses:  Metacarpal bone fracture, closed, initial encounter      Joni ReiningRonald K Catarina Huntley, PA-C 04/21/15 1400  Jeanmarie PlantJames A McShane, MD 04/21/15 [phone]

## 2015-04-21 NOTE — ED Notes (Signed)
Pt here with c/o right hand injury this am from a fall.  Right hand swollen.

## 2015-04-21 NOTE — Discharge Instructions (Signed)
Wear splint until evaluation by Ortho Doctorl

## 2015-05-18 ENCOUNTER — Encounter: Payer: Self-pay | Admitting: Student

## 2015-10-09 ENCOUNTER — Emergency Department: Payer: No Typology Code available for payment source

## 2015-10-09 ENCOUNTER — Emergency Department
Admission: EM | Admit: 2015-10-09 | Discharge: 2015-10-09 | Disposition: A | Discharge status: Home / Self Care | Payer: No Typology Code available for payment source | Attending: Emergency Medicine | Admitting: Emergency Medicine

## 2015-10-09 ENCOUNTER — Encounter: Payer: Self-pay | Admitting: Emergency Medicine

## 2015-10-09 DIAGNOSIS — I10 Essential (primary) hypertension: Secondary | ICD-10-CM | POA: Diagnosis not present

## 2015-10-09 DIAGNOSIS — Z792 Long term (current) use of antibiotics: Secondary | ICD-10-CM | POA: Diagnosis not present

## 2015-10-09 DIAGNOSIS — R059 Cough, unspecified: Secondary | ICD-10-CM

## 2015-10-09 DIAGNOSIS — R079 Chest pain, unspecified: Secondary | ICD-10-CM | POA: Diagnosis present

## 2015-10-09 DIAGNOSIS — F17210 Nicotine dependence, cigarettes, uncomplicated: Secondary | ICD-10-CM | POA: Diagnosis not present

## 2015-10-09 DIAGNOSIS — J45909 Unspecified asthma, uncomplicated: Secondary | ICD-10-CM | POA: Insufficient documentation

## 2015-10-09 DIAGNOSIS — R509 Fever, unspecified: Secondary | ICD-10-CM

## 2015-10-09 DIAGNOSIS — J449 Chronic obstructive pulmonary disease, unspecified: Secondary | ICD-10-CM | POA: Diagnosis not present

## 2015-10-09 DIAGNOSIS — Z791 Long term (current) use of non-steroidal anti-inflammatories (NSAID): Secondary | ICD-10-CM | POA: Diagnosis not present

## 2015-10-09 DIAGNOSIS — Z7951 Long term (current) use of inhaled steroids: Secondary | ICD-10-CM | POA: Insufficient documentation

## 2015-10-09 DIAGNOSIS — R05 Cough: Secondary | ICD-10-CM | POA: Diagnosis not present

## 2015-10-09 HISTORY — DX: Chronic obstructive pulmonary disease, unspecified: J44.9

## 2015-10-09 HISTORY — DX: Essential (primary) hypertension: I10

## 2015-10-09 LAB — BASIC METABOLIC PANEL
ANION GAP: 6 (ref 5–15)
BUN: 10 mg/dL (ref 6–20)
CALCIUM: 9.1 mg/dL (ref 8.9–10.3)
CO2: 28 mmol/L (ref 22–32)
CREATININE: 0.61 mg/dL (ref 0.44–1.00)
Chloride: 107 mmol/L (ref 101–111)
GFR calc Af Amer: >60 mL/min (ref >60)
GLUCOSE: 108 mg/dL — AB (ref 65–99)
Potassium: 3.4 mmol/L — ABNORMAL LOW (ref 3.5–5.1)
Sodium: 141 mmol/L (ref 135–145)

## 2015-10-09 LAB — CBC
HCT: 47.3 % — ABNORMAL HIGH (ref 35.0–47.0)
HEMOGLOBIN: 15.7 g/dL (ref 12.0–16.0)
MCH: 29.8 pg (ref 26.0–34.0)
MCHC: 33.1 g/dL (ref 32.0–36.0)
MCV: 90.2 fL (ref 80.0–100.0)
PLATELETS: 236 10*3/uL (ref 150–440)
RBC: 5.25 MIL/uL — ABNORMAL HIGH (ref 3.80–5.20)
RDW: 13.9 % (ref 11.5–14.5)
WBC: 3.1 10*3/uL — ABNORMAL LOW (ref 3.6–11.0)

## 2015-10-09 LAB — TROPONIN I

## 2015-10-09 MED ORDER — IOPAMIDOL (ISOVUE-370) INJECTION 76%
75.0000 mL | Freq: Once | INTRAVENOUS | Status: AC | PRN
  Administered 2015-10-09: 75 mL via INTRAVENOUS

## 2015-10-09 MED ORDER — BENZONATATE 100 MG PO CAPS: 100.0000 mg | ORAL_CAPSULE | Freq: Four times a day (QID) | ORAL | Status: DC | PRN

## 2015-10-09 MED ORDER — SODIUM CHLORIDE 0.9 % IV SOLN
Freq: Once | INTRAVENOUS | Status: AC
  Administered 2015-10-09: 12:00:00 via INTRAVENOUS

## 2015-10-09 MED ORDER — IPRATROPIUM-ALBUTEROL 0.5-2.5 (3) MG/3ML IN SOLN
3.0000 mL | Freq: Once | RESPIRATORY_TRACT | Status: AC
  Administered 2015-10-09: 3 mL via RESPIRATORY_TRACT
  Filled 2015-10-09: qty 3

## 2015-10-09 MED ORDER — AZITHROMYCIN 250 MG PO TABS
ORAL_TABLET | ORAL | Status: AC
Start: 2015-10-09 — End: 2015-10-14

## 2015-10-09 NOTE — ED Notes (Signed)
Pt transported to CT ?

## 2015-10-09 NOTE — ED Notes (Signed)
Patient to ER with fevers (102 = highest), chest pain, and chest congestion.

## 2015-10-09 NOTE — Discharge Instructions (Signed)
Cough, Adult Coughing is a reflex that clears your throat and your airways. Coughing helps to heal and protect your lungs. It is normal to cough occasionally, but a cough that happens with other symptoms or lasts a long time may be a sign of a condition that needs treatment. A cough may last only 2-3 weeks (acute), or it may last longer than 8 weeks (chronic). CAUSES Coughing is commonly caused by:  Breathing in substances that irritate your lungs.  A viral or bacterial respiratory infection.  Allergies.  Asthma.  Postnasal drip.  Smoking.  Acid backing up from the stomach into the esophagus (gastroesophageal reflux).  Certain medicines.  Chronic lung problems, including COPD (or rarely, lung cancer).  Other medical conditions such as heart failure. HOME CARE INSTRUCTIONS  Pay attention to any changes in your symptoms. Take these actions to help with your discomfort:  Take medicines only as told by your health care provider.  If you were prescribed an antibiotic medicine, take it as told by your health care provider. Do not stop taking the antibiotic even if you start to feel better.  Talk with your health care provider before you take a cough suppressant medicine.  Drink enough fluid to keep your urine clear or pale yellow.  If the air is dry, use a cold steam vaporizer or humidifier in your bedroom or your home to help loosen secretions.  Avoid anything that causes you to cough at work or at home.  If your cough is worse at night, try sleeping in a semi-upright position.  Avoid cigarette smoke. If you smoke, quit smoking. If you need help quitting, ask your health care provider.  Avoid caffeine.  Avoid alcohol.  Rest as needed. SEEK MEDICAL CARE IF:   You have new symptoms.  You cough up pus.  Your cough does not get better after 2-3 weeks, or your cough gets worse.  You cannot control your cough with suppressant medicines and you are losing sleep.  You  develop pain that is getting worse or pain that is not controlled with pain medicines.  You have a fever.  You have unexplained weight loss.  You have night sweats. SEEK IMMEDIATE MEDICAL CARE IF:  You cough up blood.  You have difficulty breathing.  Your heartbeat is very fast.   This information is not intended to replace advice given to you by your health care provider. Make sure you discuss any questions you have with your health care provider.   Document Released: 12/09/2010 Document Revised: 03/03/2015 Document Reviewed: 08/19/2014 [employer] Interactive Patient Education [employer]   I will give you a prescription for Zithromax. Please use that as directed this to him the first day 1 every day after that. Return please for higher fever shortness of breath feeling sicker or if you're not any better in about 2 days. Please follow-up with your regular doctor Monday. Please have your doctor review the CAT scan report that we did today.  You can take Tessalon Perles for cough as directed.  [facility] or Dr. Elisabeth CaraGilbert's office since she don't have a regular medical doctor. You can also follow-up with [place] urgent care or return here if you get sicker.

## 2015-10-09 NOTE — ED Provider Notes (Signed)
[facility] Emergency Department Provider Note  ____________________________________________  Time seen: Approximately 11:46 AM  I have reviewed the triage vital signs and the nursing notes.   HISTORY  Chief Complaint Chest Pain   HPI Wanda Rogers is a 55 y.o. female who complains of a cough with pain in her chest with the cough. She's had a fever up to 102.3 in the last couple days. Cough is nonproductive. Patient also reports she's choked on food a couple times and may have aspirated something. Patient had her centimeters dilated in [place] last year by Dr. Acquanetta Chain since retired. She reports she still sometimes has trouble with things started to get caught in her esophagus.   Past Medical History  Diagnosis Date  . Marfan's syndrome   . Asthma   . Marfan syndrome   . COPD (chronic obstructive pulmonary disease) (HCC)   . Hypertension     Patient Active Problem List   Diagnosis Date Noted  . Constipation 12/14/2014  . Elevated blood pressure 09/15/2014  . Dysphagia 09/15/2014  . Abdominal pain, chronic, epigastric 09/15/2014  . Overweight 09/15/2014  . Preventative health care 09/15/2014  . Asthma, chronic 09/15/2014  . Tobacco abuse 09/15/2014    Past Surgical History  Procedure Laterality Date  . Muscle biopsy    . Tubal ligation    . Esophageal dilation      Current Outpatient Rx  Name  Route  Sig  Dispense  Refill  . albuterol (PROVENTIL HFA;VENTOLIN HFA) 108 (90 BASE) MCG/ACT inhaler   Inhalation   Inhale 2 puffs into the lungs every 6 (six) hours as needed for wheezing or shortness of breath (cough).   1 Inhaler   0   . azithromycin (ZITHROMAX Z-PAK) 250 MG tablet      Take 2 tablets (500 mg) on  Day 1,  followed by 1 tablet (250 mg) once daily on Days 2 through 5.   6 each   0   . benzonatate (TESSALON PERLES) 100 MG capsule   Oral   Take 1 capsule (100 mg total) by mouth every 6 (six) hours as needed for cough.   30  capsule   0   . budesonide (PULMICORT) 180 MCG/ACT inhaler   Inhalation   Inhale 2 puffs into the lungs 2 (two) times daily.   1 Inhaler   5   . ibuprofen (ADVIL,MOTRIN) 800 MG tablet   Oral   Take 1 tablet (800 mg total) by mouth every 8 (eight) hours as needed for moderate pain.   15 tablet   0   . omeprazole (PRILOSEC) 40 MG capsule   Oral   Take 1 capsule (40 mg total) by mouth 2 (two) times daily.   60 capsule   11   . traMADol (ULTRAM) 50 MG tablet   Oral   Take 1 tablet (50 mg total) by mouth every 6 (six) hours as needed for moderate pain.   12 tablet   0     Allergies Flexeril  Family History  Problem Relation Age of Onset  . Colon cancer Maternal Grandmother   . Heart disease Son   . Kidney disease Son   . Liver disease Son     Social History Social History  Substance Use Topics  . Smoking status: Current Every Day Smoker -- 0.30 packs/day    Types: Cigarettes  . Smokeless tobacco: None  . Alcohol Use: 0.6 oz/week    1 Cans of beer, 0 Standard drinks  or equivalent per week    Review of Systems Constitutional: No fever/chills Eyes: No visual changes. ENT: No sore throat. Cardiovascular:s chest pain. Respiratory: Denies shortness of breath. Gastrointestinal: No abdominal pain.  No nausea, no vomiting.  No diarrhea.  No constipation. Genitourinary: Negative for dysuria. Musculoskeletal: Negative for back pain. Skin: Negative for rash. Neurological: Negative for headaches, focal weakness or numbness.  10-point ROS otherwise negative.  ____________________________________________   PHYSICAL EXAM:  VITAL SIGNS: ED Triage Vitals  Enc Vitals Group     BP 10/09/15 1020 149/70 mmHg     Pulse Rate 10/09/15 1020 74     Resp 10/09/15 1020 20     Temp 10/09/15 1020 98.8 F (37.1 C)     Temp Source 10/09/15 1020 Oral     SpO2 10/09/15 1020 94 %     Weight 10/09/15 1020 150 lb (68.04 kg)     Height 10/09/15 1020 5' 10.5" (1.791 m)     Head  Cir --      Peak Flow --      Pain Score 10/09/15 1021 10     Pain Loc --      Pain Edu? --      Excl. in GC? --     Constitutional: Alert and oriented. Well appearing and in no acute distress. Eyes: Conjunctivae are normal. PERRL. EOMI. Head: Atraumatic. Nose: No congestion/rhinnorhea. Mouth/Throat: Mucous membranes are moist.  Oropharynx non-erythematous. Neck: No stridor.  Cardiovascular: Normal rate, regular rhythm. Grossly normal heart sounds.  Good peripheral circulation. Respiratory: Normal respiratory effort.  No retractions. Lungs CTAB. Gastrointestinal: Soft and nontender. No distention. No abdominal bruits. No CVA tenderness. Musculoskeletal: No lower extremity tenderness nor edema.  No joint effusions. Neurologic:  Normal speech and language. No gross focal neurologic deficits are appreciated. No gait instability. Skin:  Skin is warm, dry and intact. No rash noted. Psychiatric: Mood and affect are normal. Speech and behavior are normal.  ____________________________________________   LABS (all labs ordered are listed, but only abnormal results are displayed)  Labs Reviewed  BASIC METABOLIC PANEL - Abnormal; Notable for the following:    Potassium 3.4 (*)    Glucose, Bld 108 (*)    All other components within normal limits  CBC - Abnormal; Notable for the following:    WBC 3.1 (*)    RBC 5.25 (*)    HCT 47.3 (*)    All other components within normal limits  TROPONIN I   ____________________________________________  EKG  EKG read and interpreted by me shows normal sinus rhythm rate of 72 normal axis computer is reading some right atrial enlargement which is probably correct ____________________________________________  RADIOLOGY  CT of the chest shows no PE there are some enlarged nodes in some areas of groundglass appearance. In view of the patient's fever and cough this is probably an atypical  infection. ____________________________________________   PROCEDURES    ____________________________________________   INITIAL IMPRESSION / ASSESSMENT AND PLAN / ED COURSE  Pertinent labs & imaging results that were available during my care of the patient were reviewed by me and considered in my medical decision making (see chart for details).   ____________________________________________   FINAL CLINICAL IMPRESSION(S) / ED DIAGNOSES  Final diagnoses:  Cough  Other specified fever      Arnaldo NatalPaul F Malinda, MD 10/09/15 2103

## 2016-06-12 ENCOUNTER — Other Ambulatory Visit: Payer: Self-pay | Admitting: Internal Medicine

## 2016-07-16 IMAGING — CT CT ANGIO CHEST
2 of 6 series · 18 of 46 positions shown · IV contrast (APPLIED)
Comparison: No priors.

CLINICAL DATA: 54-year-old female presenting to the emergency
department with history of chest congestion, cough, fever and chest
pain since this morning.

EXAM:
CT ANGIOGRAPHY CHEST WITH CONTRAST
TECHNIQUE: Multidetector CT imaging of the chest was performed using the
standard protocol during bolus administration of intravenous
contrast. Multiplanar CT image reconstructions and MIPs were
obtained to evaluate the vascular anatomy.
CONTRAST:  75 mL of Isovue 370.

[Series 5: pe thins 1.5 · axial · 0.68mm/px · z∈[+762,+1064]mm · 15 of 282 slices shown]
[im 15/282  lung]
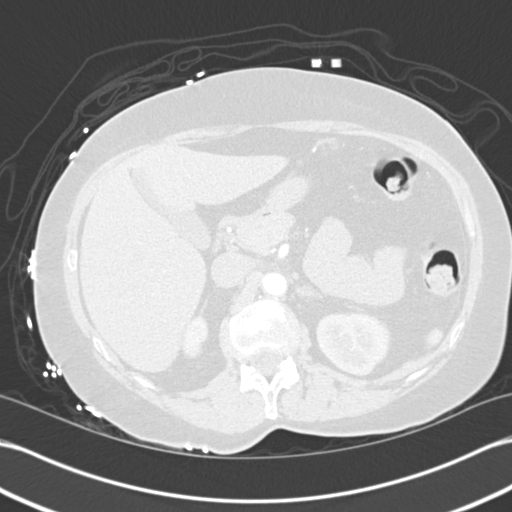
[im 30/282  soft-tissue]
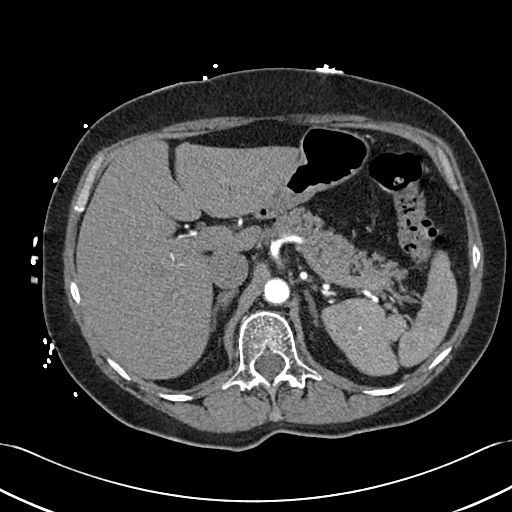
[im 60/282  lung]
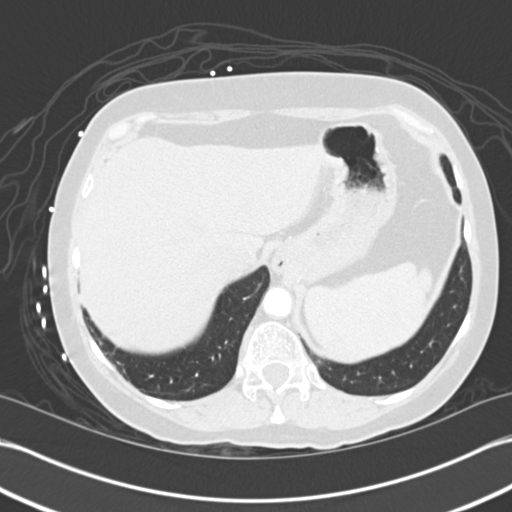
[im 74/282  soft-tissue]
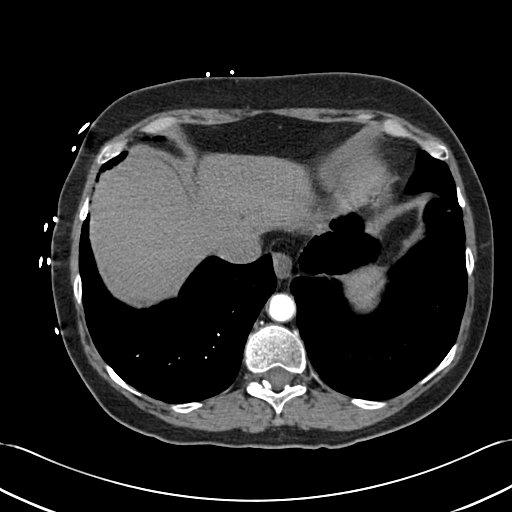
[im 89/282  lung]
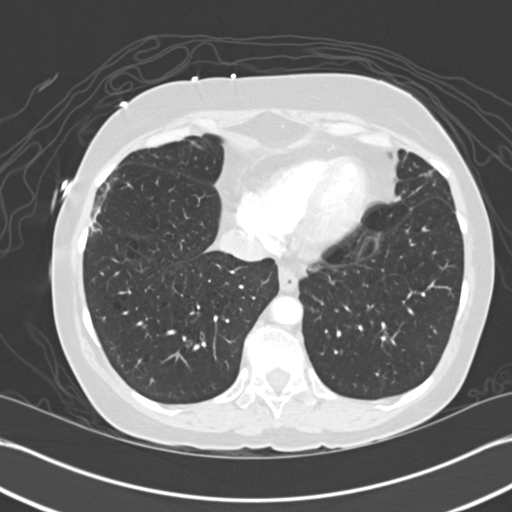
[im 104/282  soft-tissue]
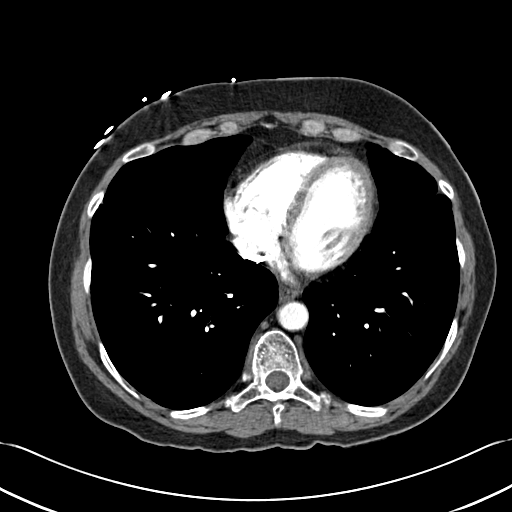
[im 119/282  lung]
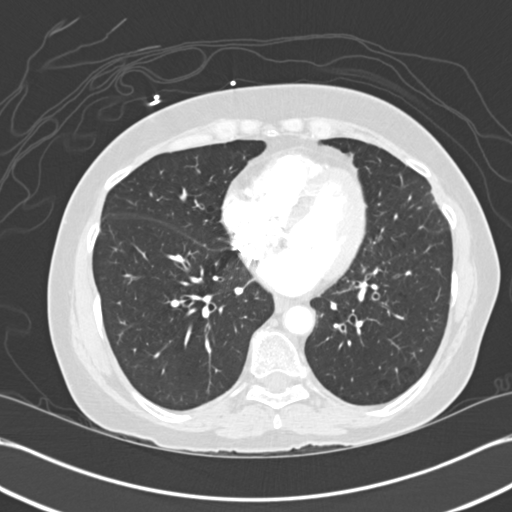
[im 148/282  soft-tissue]
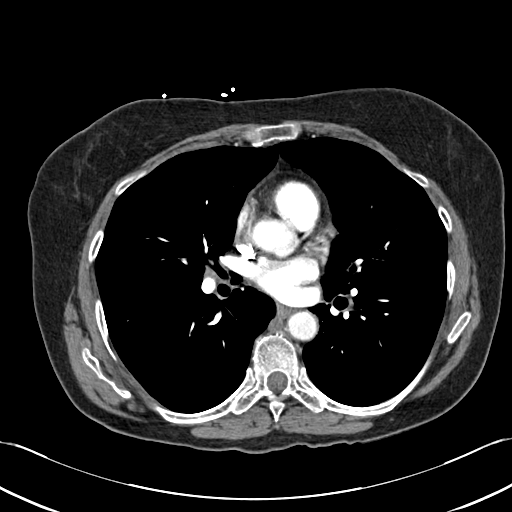
[im 163/282  lung]
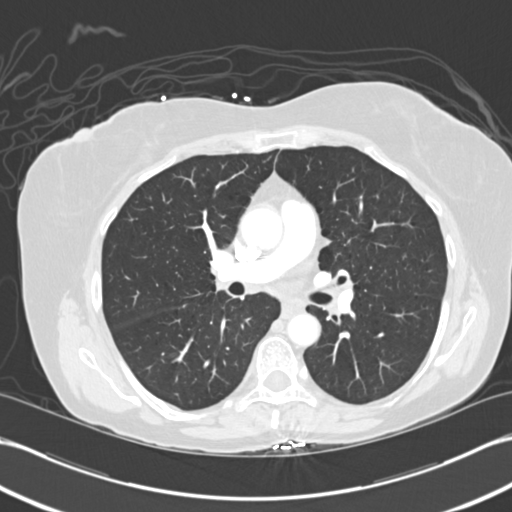
[im 178/282  soft-tissue]
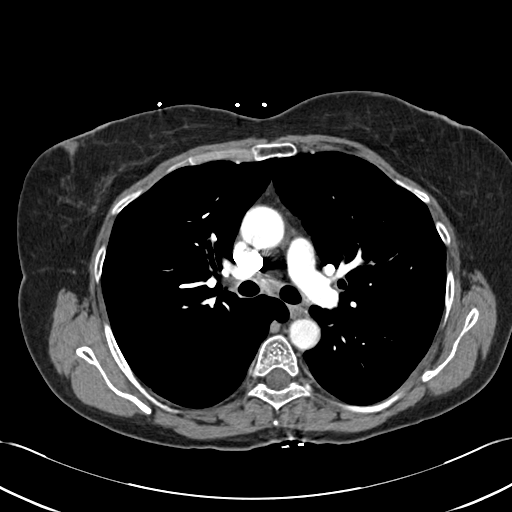
[im 193/282  lung]
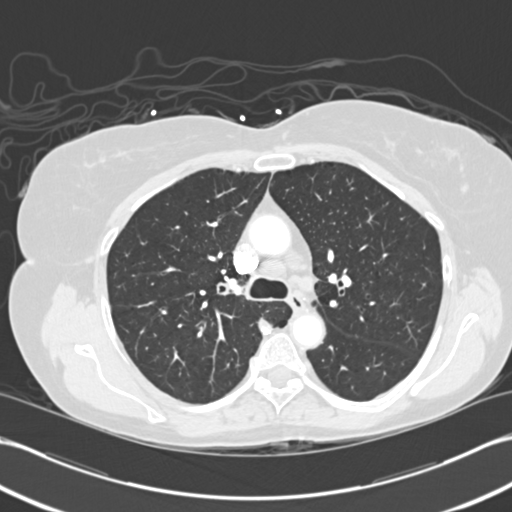
[im 208/282  soft-tissue]
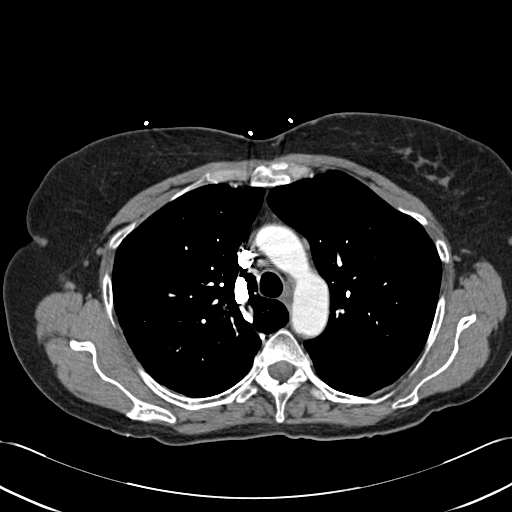
[im 237/282  lung]
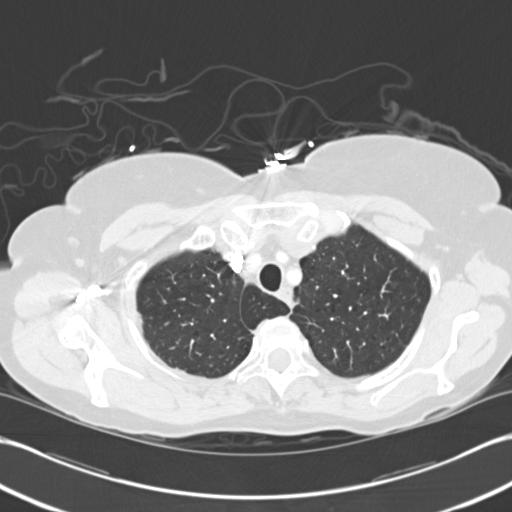
[im 252/282  soft-tissue]
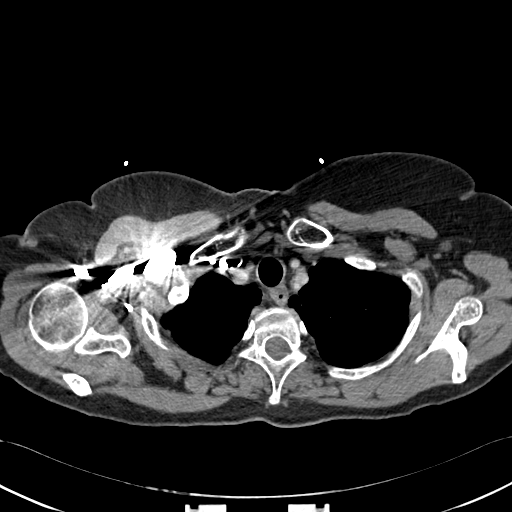
[im 267/282  lung]
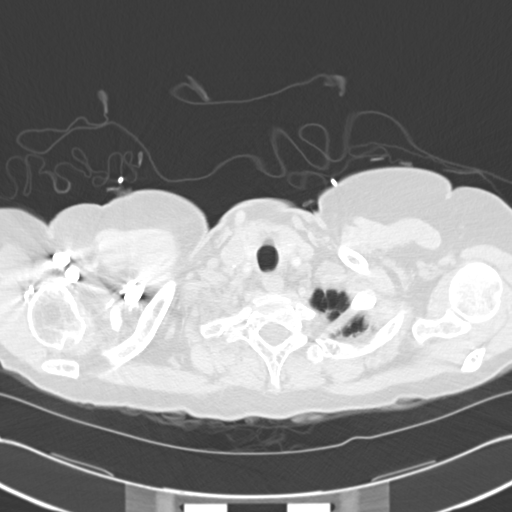

[Series 7: cor mpr 2.0 · coronal · 0.61mm/px · 3 of 120 slices shown]
[im 30/120  soft-tissue]
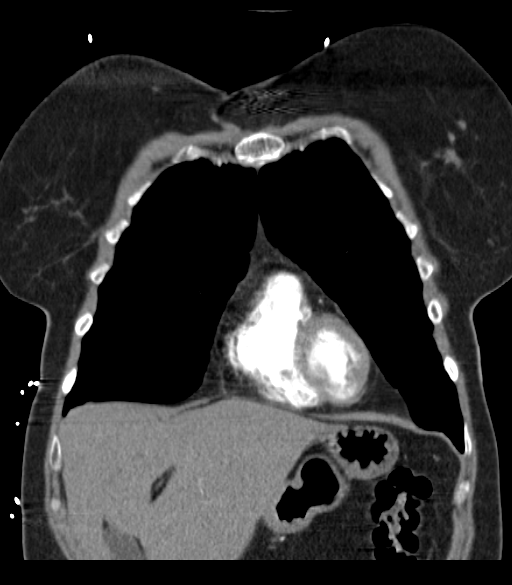
[im 60/120  soft-tissue]
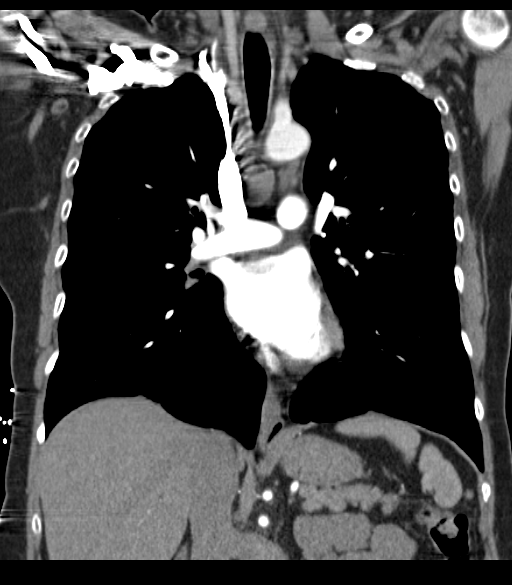
[im 90/120  soft-tissue]
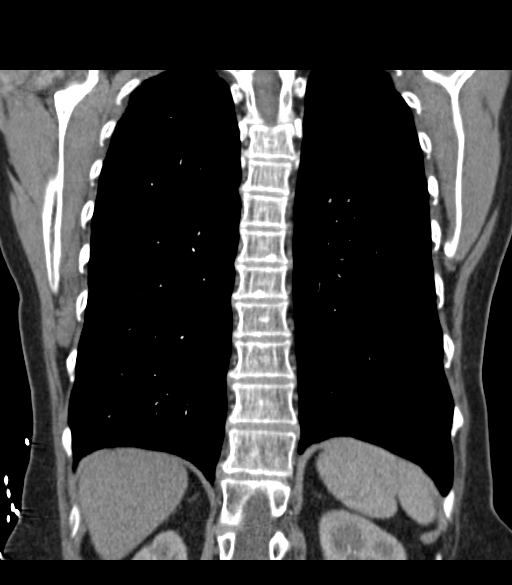

[18 of 46 positions shown; findings below may reference images not displayed]

FINDINGS: Mediastinum/Lymph Nodes: There are no filling defects within the
pulmonary arterial tree to suggest underlying pulmonary embolism.
Heart size is normal. There is no significant pericardial fluid,
thickening or pericardial calcification. Several borderline enlarged
and mildly enlarged mediastinal and hilar lymph nodes are noted,
measuring up to 1.1 cm and short axis in the low right paratracheal
nodal station and 1.1 cm in short axis in the right hilar region.
Esophagus is unremarkable in appearance. No axillary
lymphadenopathy.

Lungs/Pleura: Mild diffuse bronchial wall thickening with mild
centrilobular and paraseptal emphysema. A few areas of subtle
peripheral predominant ground-glass attenuation and septal
thickening are noted in the lung bases. No frank honeycombing
identified. No confluent consolidative airspace disease. No pleural
effusions. No suspicious appearing pulmonary nodules or masses.
Azygos lobe (normal anatomical variant) incidentally noted.

Upper Abdomen: Unremarkable.

Musculoskeletal/Soft Tissues: There are no aggressive appearing
lytic or blastic lesions noted in the visualized portions of the
skeleton.

Review of the MIP images confirms the above findings.
IMPRESSION: 1. No evidence of pulmonary embolism.
2. There are patchy areas of peripheral predominant ground-glass
attenuation and subpleural reticulation which are nonspecific, but
favored to be chronic, potentially an indicator of interstitial lung
disease. Less likely, acute atypical infection could have a similar
appearance, but that is not favored. Repeat high-resolution chest CT
is recommended in the next 6-12 months to assess for temporal
changes in the appearance of the lung parenchyma.
3. In addition, there is mild diffuse bronchial wall thickening with
mild centrilobular and paraseptal emphysema; imaging findings
suggestive of underlying COPD.
4. Mildly enlarged mediastinal and right hilar lymph nodes are
nonspecific, but are commonly seen in the setting of interstitial
lung disease. Attention at time of followup high-resolution chest CT
is recommended.

## 2016-08-03 ENCOUNTER — Emergency Department
Admission: EM | Admit: 2016-08-03 | Discharge: 2016-08-03 | Disposition: A | Discharge status: Home / Self Care | Payer: No Typology Code available for payment source | Attending: Emergency Medicine | Admitting: Emergency Medicine

## 2016-08-03 ENCOUNTER — Encounter: Payer: Self-pay | Admitting: Emergency Medicine

## 2016-08-03 DIAGNOSIS — N611 Abscess of the breast and nipple: Secondary | ICD-10-CM | POA: Diagnosis present

## 2016-08-03 DIAGNOSIS — I10 Essential (primary) hypertension: Secondary | ICD-10-CM | POA: Insufficient documentation

## 2016-08-03 DIAGNOSIS — J449 Chronic obstructive pulmonary disease, unspecified: Secondary | ICD-10-CM | POA: Insufficient documentation

## 2016-08-03 DIAGNOSIS — F17210 Nicotine dependence, cigarettes, uncomplicated: Secondary | ICD-10-CM | POA: Insufficient documentation

## 2016-08-03 DIAGNOSIS — J45909 Unspecified asthma, uncomplicated: Secondary | ICD-10-CM | POA: Diagnosis not present

## 2016-08-03 DIAGNOSIS — Z79899 Other long term (current) drug therapy: Secondary | ICD-10-CM | POA: Diagnosis not present

## 2016-08-03 MED ORDER — CEPHALEXIN 500 MG PO CAPS: 500.0000 mg | ORAL_CAPSULE | Freq: Four times a day (QID) | ORAL | 0 refills | Status: AC

## 2016-08-03 MED ORDER — HYDROCODONE-ACETAMINOPHEN 5-325 MG PO TABS
ORAL_TABLET | ORAL | Status: AC
  Filled 2016-08-03: qty 1

## 2016-08-03 MED ORDER — HYDROCODONE-ACETAMINOPHEN 5-325 MG PO TABS: 2.0000 | ORAL_TABLET | Freq: Once | ORAL | Status: DC

## 2016-08-03 MED ORDER — HYDROCODONE-ACETAMINOPHEN 5-325 MG PO TABS
1.0000 | ORAL_TABLET | Freq: Once | ORAL | Status: AC
  Administered 2016-08-03: 1 via ORAL

## 2016-08-03 MED ORDER — HYDROCODONE-ACETAMINOPHEN 5-325 MG PO TABS: 1.0000 | ORAL_TABLET | ORAL | 0 refills | Status: AC | PRN

## 2016-08-03 NOTE — Discharge Instructions (Signed)
Continue using warm moist compresses to the area frequently. Begin taking Keflex 500 mg 4 times a day until finished. Norco as needed for pain. The pain medication is a narcotic and you cannot drive or operate machinery while taking this to decrease her chances of getting hurt. Call today to make an appointment with [facility] for follow-up and scheduling of your biopsy.

## 2016-08-03 NOTE — ED Provider Notes (Signed)
[facility] Emergency Department Provider Note   ____________________________________________   First MD Initiated Contact with Patient 08/03/16 [phone]     (approximate)  I have reviewed the triage vital signs and the nursing notes.   HISTORY  Chief Complaint Abscess   HPI Wanda Rogers is a 56 y.o. female is here with complaint of right nipple redness and soreness for 2-3 days. Patient states that occasionally she has some drainage. She is uncertain whether she has had any fever or chills. She is been taking over-the-counter medication without any improvement. Patient states that she has not had any problems such as this. After talking with this patient she does share that she has had an abnormal mammogram last year and was told that she needed to come back for a biopsy. Patient states that she did not return to the clinic for any further testing.   Past Medical History:  Diagnosis Date  . Asthma   . COPD (chronic obstructive pulmonary disease) (HCC)   . Hypertension   . Marfan syndrome   . Marfan's syndrome     Patient Active Problem List   Diagnosis Date Noted  . Constipation 12/14/2014  . Elevated blood pressure 09/15/2014  . Dysphagia 09/15/2014  . Abdominal pain, chronic, epigastric 09/15/2014  . Overweight 09/15/2014  . Preventative health care 09/15/2014  . Asthma, chronic 09/15/2014  . Tobacco abuse 09/15/2014    Past Surgical History:  Procedure Laterality Date  . ESOPHAGEAL DILATION    . MUSCLE BIOPSY    . TUBAL LIGATION      Prior to Admission medications   Medication Sig Start Date End Date Taking? Authorizing Provider  budesonide (PULMICORT) 180 MCG/ACT inhaler Inhale 2 puffs into the lungs 2 (two) times daily. 03/18/15   Deneise LeverParth Saraiya, MD  cephALEXin (KEFLEX) 500 MG capsule Take 1 capsule (500 mg total) by mouth 4 (four) times daily. 08/03/16   Tommi Rumpshonda L Dwayna Kentner, PA-C  HYDROcodone-acetaminophen (NORCO/VICODIN) 5-325 MG tablet  Take 1 tablet by mouth every 4 (four) hours as needed for moderate pain. 08/03/16   Tommi Rumpshonda L Davinity Fanara, PA-C    Allergies Flexeril [cyclobenzaprine]  Family History  Problem Relation Age of Onset  . Colon cancer Maternal Grandmother   . Heart disease Son   . Kidney disease Son   . Liver disease Son     Social History Social History  Substance Use Topics  . Smoking status: Current Every Day Smoker    Packs/day: 0.30    Types: Cigarettes  . Smokeless tobacco: Never Used  . Alcohol use 0.6 oz/week    1 Cans of beer per week    Review of Systems Constitutional: No fever/chills Eyes: No visual changes. ENT: No sore throat. Cardiovascular: Denies chest pain. Respiratory: Denies shortness of breath. Gastrointestinal: No abdominal pain.  No nausea, no vomiting.  No diarrhea.  No constipation. Genitourinary: Negative for dysuria. Musculoskeletal: Negative for back pain. Skin: Positive for bilateral wrist tenderness with the right being more than the left. Neurological: Negative for headaches, focal weakness or numbness.  10-point ROS otherwise negative.  ____________________________________________   PHYSICAL EXAM:  VITAL SIGNS: ED Triage Vitals [08/03/16 0803]  Enc Vitals Group     BP (!) 156/74     Pulse Rate 74     Resp 20     Temp 97.6 F (36.4 C)     Temp Source Oral     SpO2 99 %     Weight 150 lb (68 kg)  Height 5\' 9"  (1.753 m)     Head Circumference      Peak Flow      Pain Score 7     Pain Loc      Pain Edu?      Excl. in GC?     Constitutional: Alert and oriented. Well appearing and in no acute distress. Eyes: Conjunctivae are normal. PERRL. EOMI. Head: Atraumatic. Nose: No congestion/rhinnorhea. Neck: No stridor.   Cardiovascular: Normal rate, regular rhythm. Grossly normal heart sounds.  Good peripheral circulation. Respiratory: Normal respiratory effort.  No retractions. Lungs CTAB. Musculoskeletal: Moves upper and lower extremities without  difficulty. Normal gait was noted. Neurologic:  Normal speech and language. No gross focal neurologic deficits are appreciated. No gait instability. Skin:  Skin is warm, dry. Left breast no drainage is noted from the nipple and there is no localized tenderness or areas that are suspicious for abscess. There is no erythema and tenderness to palpation is minimal. I examined the right breast area lateral to the nipple is slightly erythematous, tender, warm, and without active drainage. There is evidence at the nipple area that there has been some drainage present. Psychiatric: Mood and affect are normal. Speech and behavior are normal.  ____________________________________________   LABS (all labs ordered are listed, but only abnormal results are displayed)  Labs Reviewed - No data to display  PROCEDURES  Procedure(s) performed: None  Procedures  Critical Care performed: No  ____________________________________________   INITIAL IMPRESSION / ASSESSMENT AND PLAN / ED COURSE  Pertinent labs & imaging results that were available during my care of the patient were reviewed by me and considered in my medical decision making (see chart for details).  Patient is given prescription for Keflex 500 mg 4 times a day along with Norco as needed for pain. Patient is to use warm moist compresses to the area frequently. Patient is to call today to get an appointment with her doctor at [facility] primary for follow-up and scheduling of her biopsy. At this time we will treat for abscess/cellulitis but  strongly impressed on patient that the biopsy is to rule out any cancerous lesions that was questionable on her abnormal mammography back in July.      ____________________________________________   FINAL CLINICAL IMPRESSION(S) / ED DIAGNOSES  Final diagnoses:  Abscess of right breast      NEW MEDICATIONS STARTED DURING THIS VISIT:  Discharge Medication List as of 08/03/2016  8:56 AM    START taking  these medications   Details  cephALEXin (KEFLEX) 500 MG capsule Take 1 capsule (500 mg total) by mouth 4 (four) times daily., Starting Thu 08/03/2016, Print    HYDROcodone-acetaminophen (NORCO/VICODIN) 5-325 MG tablet Take 1 tablet by mouth every 4 (four) hours as needed for moderate pain., Starting Thu 08/03/2016, Print         Note:  This document was prepared using Dragon voice recognition software and may include unintentional dictation errors.    Tommi Rumps, PA-C 08/03/16 1607    Jeanmarie Plant, MD 08/04/16 [phone]

## 2016-08-03 NOTE — ED Triage Notes (Signed)
Pt reports red, swollen area above right nipple. Denies fever. Denies nausea or vomiting.

## 2017-02-14 ENCOUNTER — Other Ambulatory Visit: Payer: Self-pay | Admitting: *Deleted

## 2017-02-14 ENCOUNTER — Inpatient Hospital Stay
Admission: RE | Admit: 2017-02-14 | Discharge: 2017-02-14 | Disposition: A | Discharge status: Home / Self Care | Payer: Self-pay | Source: Ambulatory Visit | Attending: *Deleted | Admitting: *Deleted

## 2017-02-14 DIAGNOSIS — Z9289 Personal history of other medical treatment: Secondary | ICD-10-CM

## 2017-03-07 ENCOUNTER — Ambulatory Visit: Payer: Self-pay | Attending: Oncology | Admitting: *Deleted

## 2017-03-07 ENCOUNTER — Encounter: Payer: Self-pay | Admitting: *Deleted

## 2017-03-07 VITALS — BP 146/84 | HR 64 | Temp 98.1°F | Ht 66.0 in | Wt 159.0 lb

## 2017-03-07 DIAGNOSIS — N63 Unspecified lump in unspecified breast: Secondary | ICD-10-CM

## 2017-03-07 NOTE — Progress Notes (Signed)
Subjective:     Patient ID: Wanda Rogers, female   DOB: 02/16/1961, 56 y.o.   MRN: [id number]  HPI   Review of Systems     Objective:   Physical Exam  Pulmonary/Chest: Right breast exhibits no inverted nipple, no mass, no nipple discharge, no skin change and no tenderness. Left breast exhibits tenderness. Left breast exhibits no mass, no nipple discharge and no skin change. Breasts are symmetrical.    Abdominal: There is no splenomegaly or hepatomegaly.  Genitourinary: No labial fusion. There is no rash, tenderness, lesion or injury on the right labia. There is no rash, tenderness, lesion or injury on the left labia. Cervix exhibits no motion tenderness, no discharge and no friability. Right adnexum displays no mass, no tenderness and no fullness. Left adnexum displays no mass, no tenderness and no fullness. No erythema, tenderness or bleeding in the vagina. No signs of injury around the vagina. No vaginal discharge found.       Assessment:     56 year old White female [facility] exam and mamm[facility].  States she had an abnormal mammogram and never followed up.  Last [facility] on 07/15/15 [facility].  No follow up was completed.  Patient states her 57 year old son died last year and her husband was diagnosed with cancer, she just did not follow through.  She states she also came to the ED in January of this year with complaints of a right breast abscess.  On clinical breast exam I can visualize an approximate 3 cm area of very faint reddness at 12:00 right breast.  There is also a scab noted laterally.  The patient states the reddness has been there since the infection, "but it's a lot lighter now".  There is no dominant mass, nipple discharge or lymphadenopathy noted.  Taught self breast awareness.  Pelvic exam without masses or lesions.  [facility] was negative / negative.  Informed patient that next pap will be due in 2021.  Patient has been  screened for eligibility.  She does not have any insurance, Medicare or Medicaid.  She also meets financial eligibility.  Hand-out given on the Affordable Care Act.    Plan:     Joellyn QuailsChristy Burton to schedule patient for a bilateral diagnostic mammogram and ultrasound.  Will follow-up per [facility] protocol.

## 2017-03-07 NOTE — Patient Instructions (Signed)
Gave patient hand-out, Women Staying Healthy, Active and Well from [facility], with education on breast health, pap smears, heart and colon health. 

## 2017-04-06 ENCOUNTER — Ambulatory Visit
Admission: RE | Admit: 2017-04-06 | Discharge: 2017-04-06 | Disposition: A | Discharge status: Home / Self Care | Payer: Self-pay | Source: Ambulatory Visit | Attending: Oncology | Admitting: Oncology

## 2017-04-06 DIAGNOSIS — N63 Unspecified lump in unspecified breast: Secondary | ICD-10-CM

## 2017-04-10 ENCOUNTER — Encounter: Payer: Self-pay | Admitting: *Deleted

## 2017-04-10 NOTE — Progress Notes (Signed)
Patient with birads 3 mammo requesting annual diagnostic in one year.  Appointment mailed to patient to return to [facility] on 04/10/18 @ 8:00.  [facility] to [place].

## 2017-04-17 ENCOUNTER — Other Ambulatory Visit: Payer: Self-pay

## 2018-03-11 ENCOUNTER — Ambulatory Visit: Payer: Self-pay

## 2018-04-10 ENCOUNTER — Ambulatory Visit: Payer: Self-pay | Attending: Oncology

## 2019-05-20 IMAGING — US US BREAST*R* LIMITED INC AXILLA
1 series · 11 of 11 positions shown · non-contrast
Comparison: Mammogram and ultrasound from [facility] dated
07/15/2015.

CLINICAL DATA: Patient had a diagnostic study at [facility] [[facility]]
07/15/2015. A mass was seen in the 3 o'clock region of the right
breast and 1 o'clock region of the left breast. Biopsies were
recommended. The patient did not have the biopsies and is here for
follow-up.

EXAM:
2D DIGITAL DIAGNOSTIC BILATERAL MAMMOGRAM WITH CAD AND ADJUNCT TOMO
ULTRASOUND BILATERAL BREAST

[Series 1: us breast*right* limited inc axilla · 0.05mm/px · 11 of 11 slices shown]
[im 1/11]
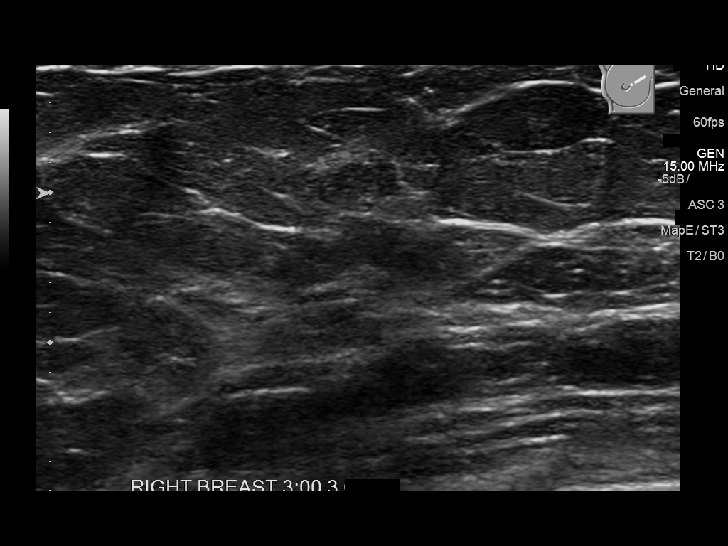
[im 2/11]
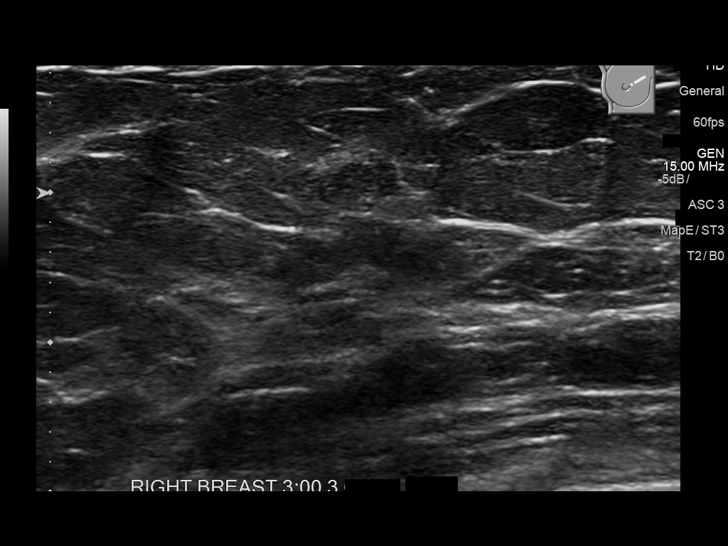
[im 3/11]
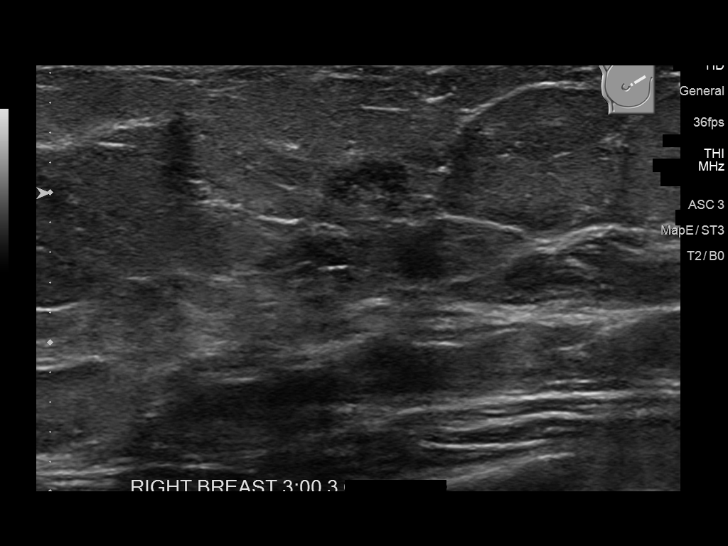
[im 4/11]
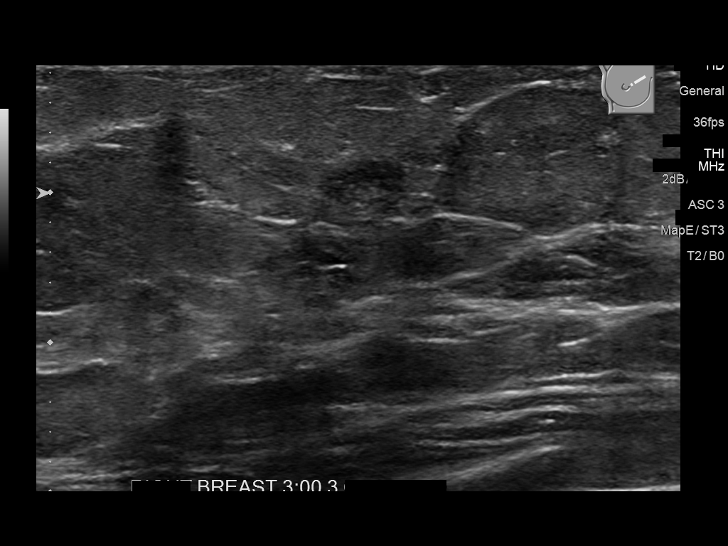
[im 5/11]
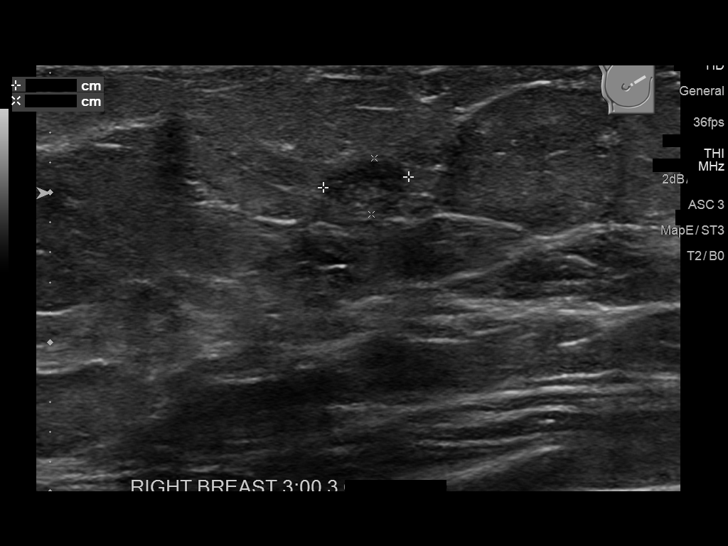
[im 6/11]
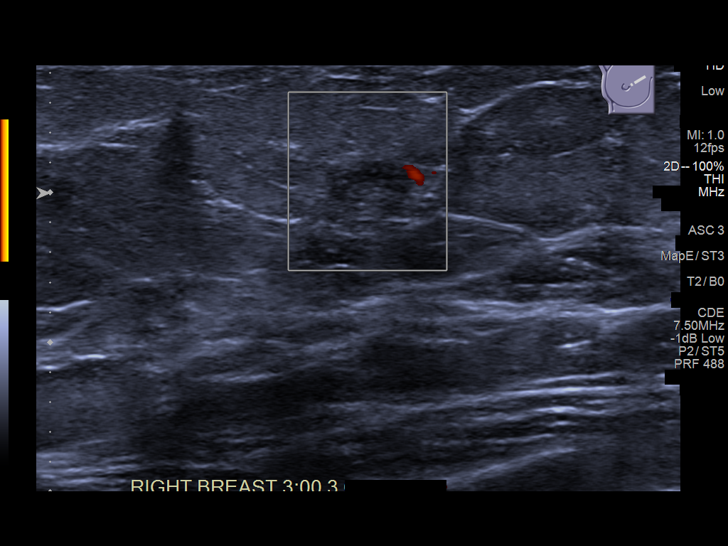
[im 7/11]
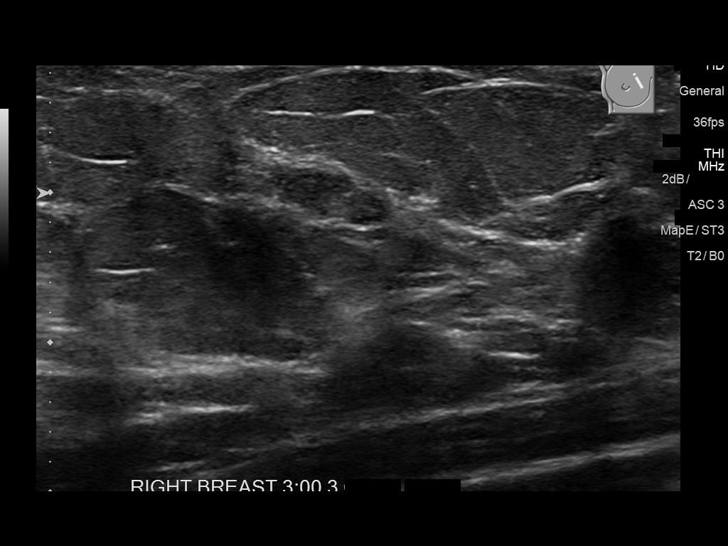
[im 8/11]
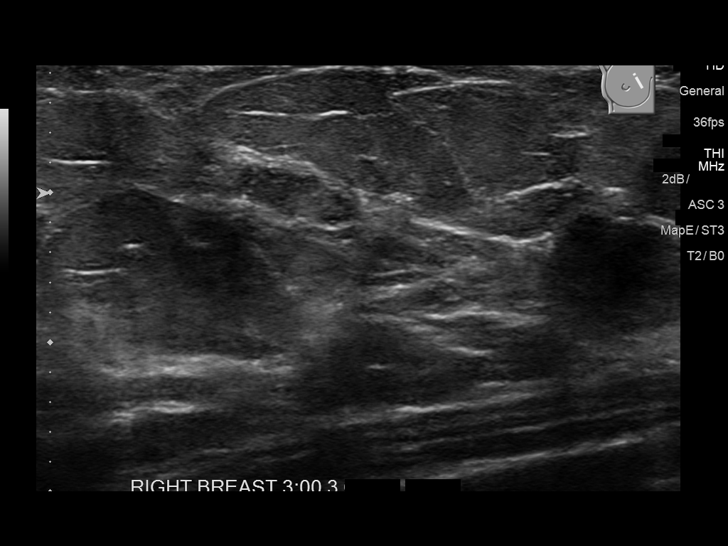
[im 9/11]
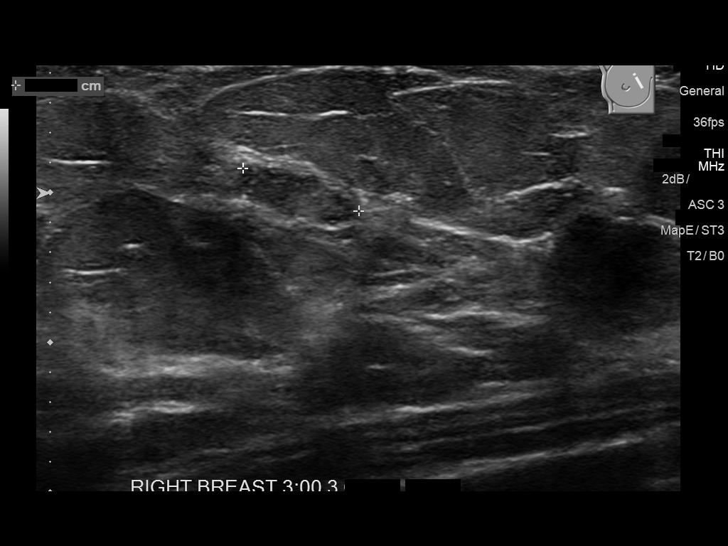
[im 10/11]
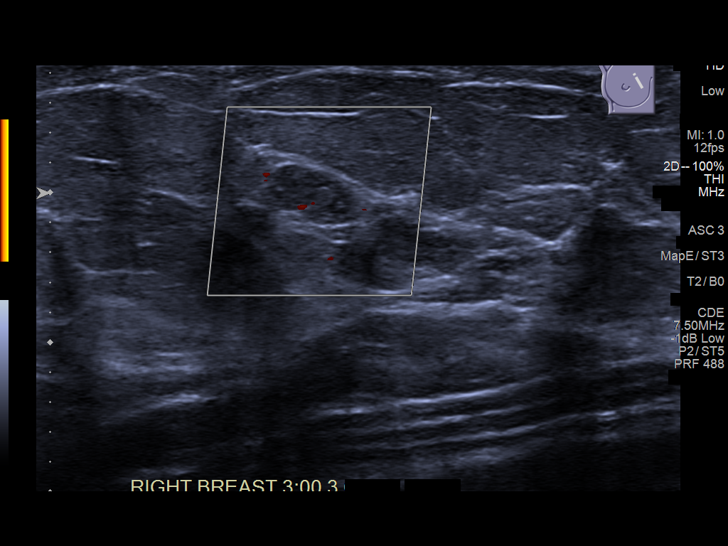
[im 11/11]
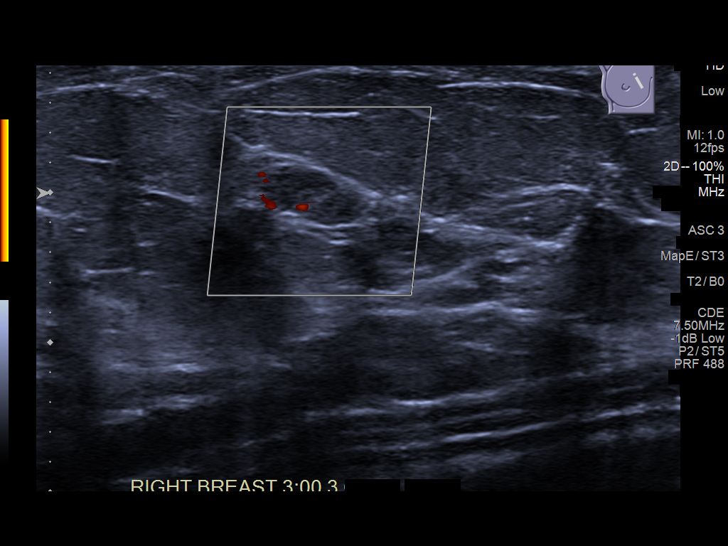

[11 of 11 positions shown; findings below may reference images not displayed]

ACR Breast Density Category b: There are scattered areas of
fibroglandular density.
FINDINGS: There is a stable 7 mm circumscribed mass in the 3 o'clock region of
the right breast and a stable 11 mm circumscribed mass in the 1
o'clock region of the left breast. No new suspicious mass or
malignant type microcalcifications identified in either breast.

Mammographic images were processed with CAD.

On physical exam, I do not palpate a mass in the 3 o'clock region of
the right breast or the 1 o'clock region of the left breast.

Targeted ultrasound is performed, showing a hypoechoic mass in the 3
o'clock region of the right breast 3 cm from the nipple measuring 6
x 4 x 8 mm. It has a central fat. Is felt to likely be an
intramammary lymph node. On the prior ultrasound it measured 6 x 3 x
6 mm. There is a well-circumscribed hypoechoic mass in the left
breast at 1 o'clock 8 cm from the nipple measuring 7 x 6 x 11 mm. On
the prior ultrasound it measured 10 x 5 x 11 mm. This is likely a
benign fibroadenoma.
IMPRESSION: Stable probable benign mass in each breast.

RECOMMENDATION:
Bilateral diagnostic mammogram and ultrasound in 1 year is
recommended to document stability for 2 years.

I have discussed the findings and recommendations with the patient.
Results were also provided in writing at the conclusion of the
visit. If applicable, a reminder letter will be sent to the patient
regarding the next appointment.

BI-RADS CATEGORY  3: Probably benign.

## 2019-05-20 IMAGING — US US BREAST*L* LIMITED INC AXILLA
1 series · 8 of 8 positions shown · non-contrast
Comparison: Mammogram and ultrasound from [facility]] dated
07/15/2015.

CLINICAL DATA: Patient had a diagnostic study at [facility]]
07/15/2015. A mass was seen in the 3 o'clock region of the right
breast and 1 o'clock region of the left breast. Biopsies were
recommended. The patient did not have the biopsies and is here for
follow-up.

EXAM:
2D DIGITAL DIAGNOSTIC BILATERAL MAMMOGRAM WITH CAD AND ADJUNCT TOMO
ULTRASOUND BILATERAL BREAST

[Series 1: us breast*left* limited inc axilla · 0.06mm/px · 8 of 8 slices shown]
[im 1/8]
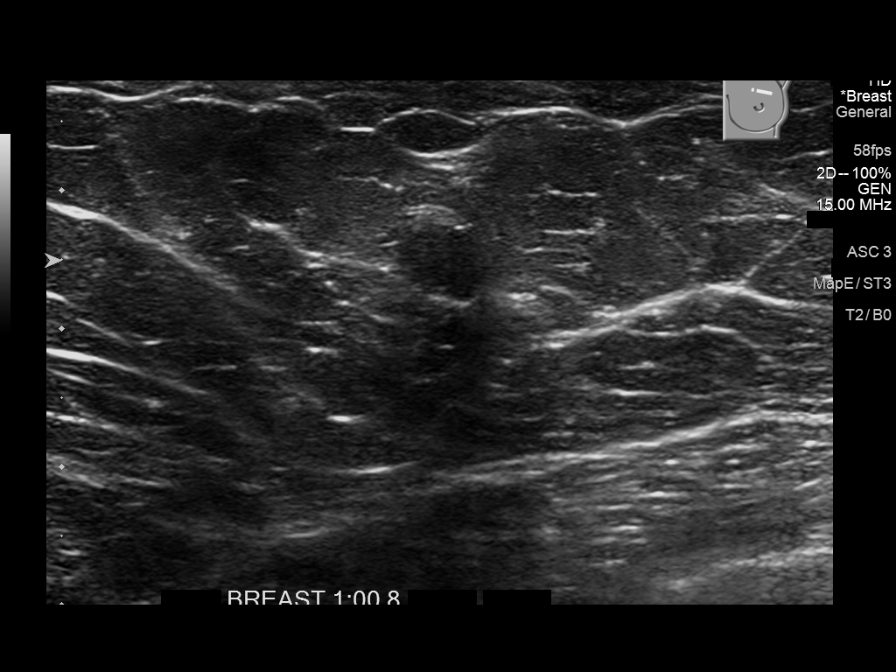
[im 2/8]
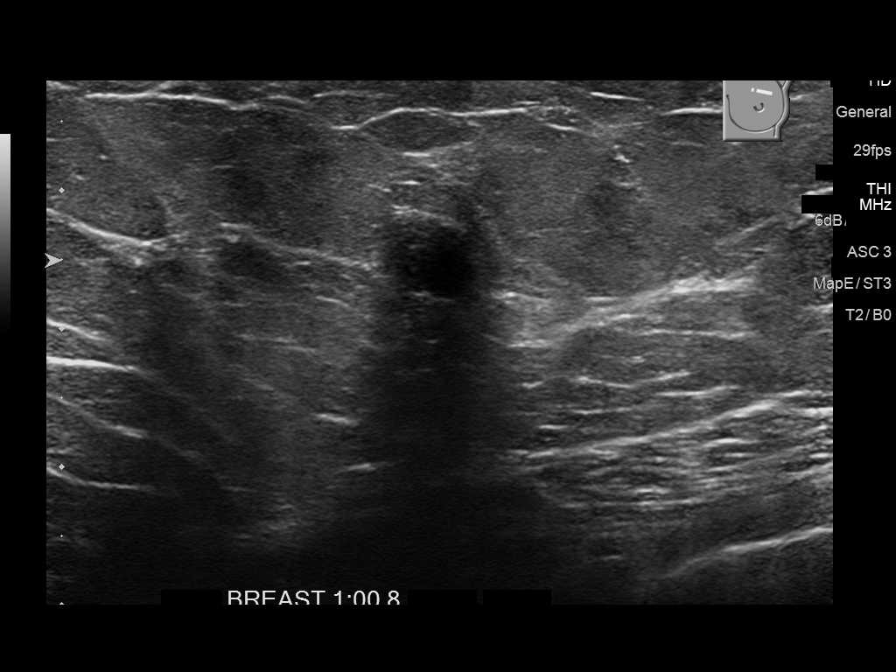
[im 3/8]
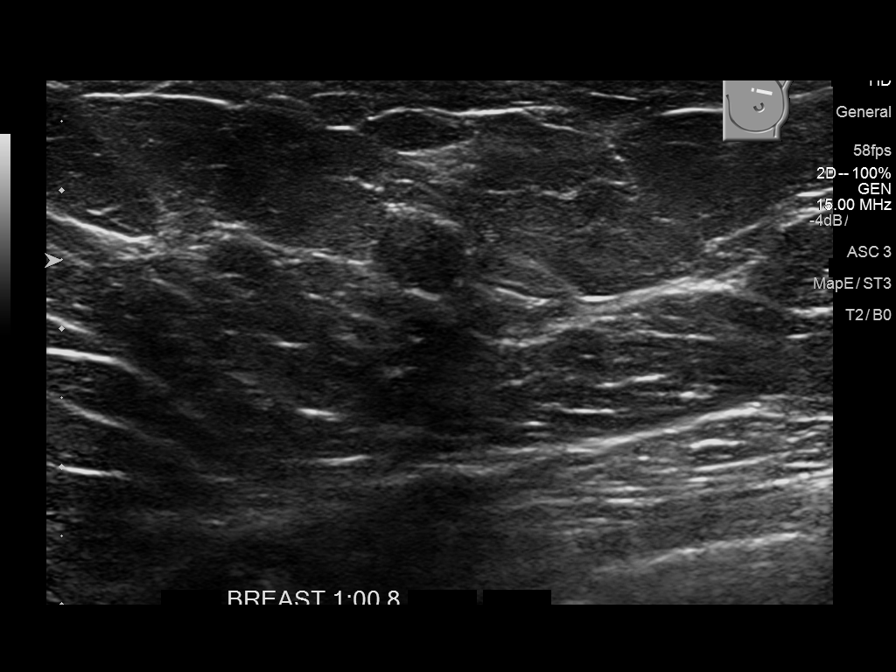
[im 4/8]
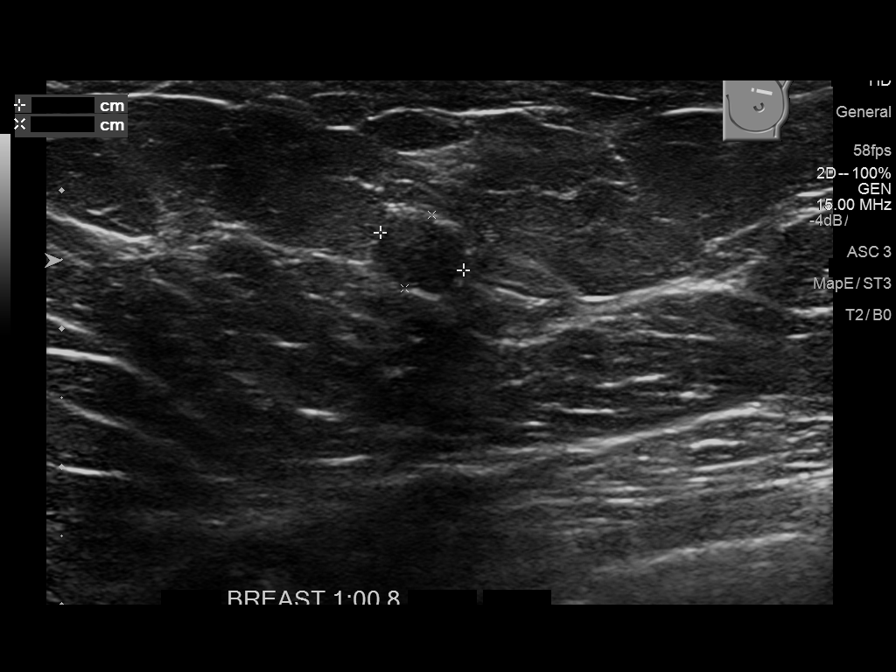
[im 5/8]
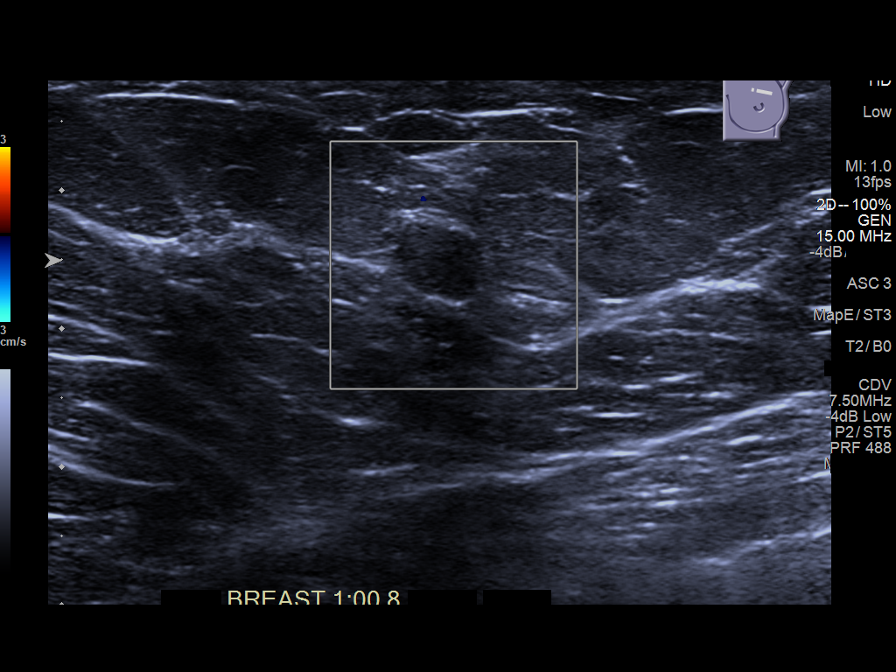
[im 6/8]
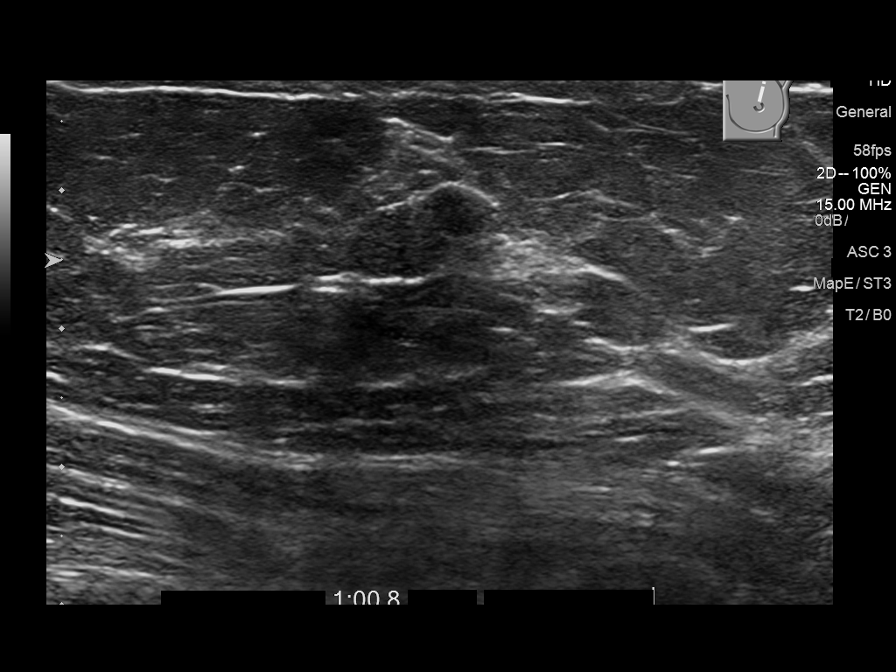
[im 7/8]
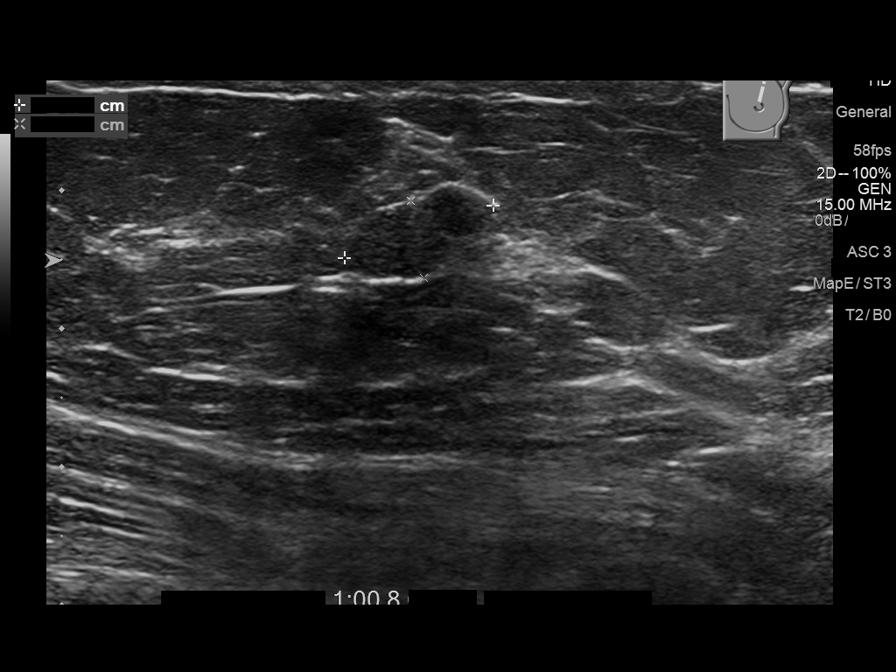
[im 8/8]
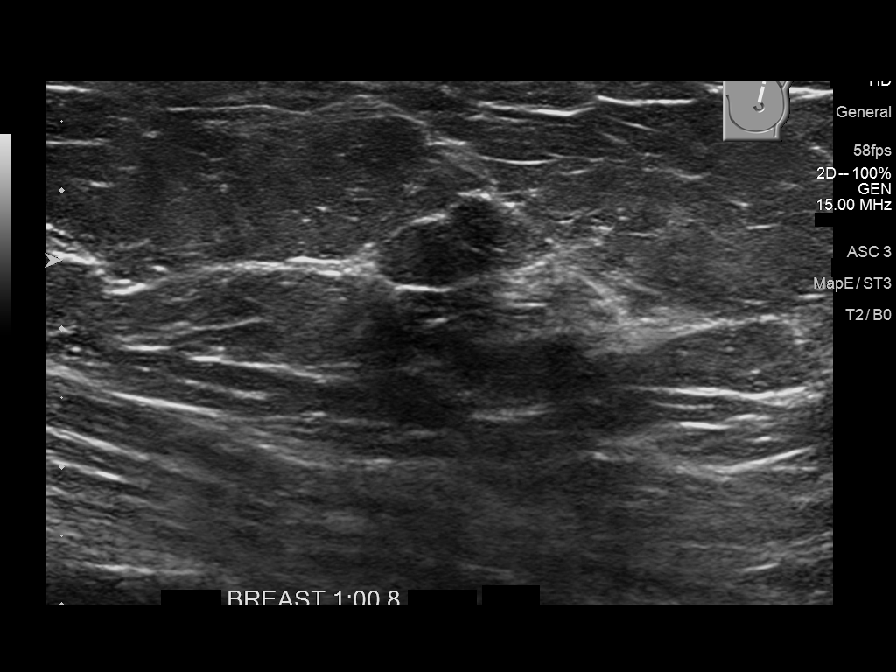

[8 of 8 positions shown; findings below may reference images not displayed]

ACR Breast Density Category b: There are scattered areas of
fibroglandular density.
FINDINGS: There is a stable 7 mm circumscribed mass in the 3 o'clock region of
the right breast and a stable 11 mm circumscribed mass in the 1
o'clock region of the left breast. No new suspicious mass or
malignant type microcalcifications identified in either breast.

Mammographic images were processed with CAD.

On physical exam, I do not palpate a mass in the 3 o'clock region of
the right breast or the 1 o'clock region of the left breast.

Targeted ultrasound is performed, showing a hypoechoic mass in the 3
o'clock region of the right breast 3 cm from the nipple measuring 6
x 4 x 8 mm. It has a central fat. Is felt to likely be an
intramammary lymph node. On the prior ultrasound it measured 6 x 3 x
6 mm. There is a well-circumscribed hypoechoic mass in the left
breast at 1 o'clock 8 cm from the nipple measuring 7 x 6 x 11 mm. On
the prior ultrasound it measured 10 x 5 x 11 mm. This is likely a
benign fibroadenoma.
IMPRESSION: Stable probable benign mass in each breast.

RECOMMENDATION:
Bilateral diagnostic mammogram and ultrasound in 1 year is
recommended to document stability for 2 years.

I have discussed the findings and recommendations with the patient.
Results were also provided in writing at the conclusion of the
visit. If applicable, a reminder letter will be sent to the patient
regarding the next appointment.

BI-RADS CATEGORY  3: Probably benign.
